# Patient Record
Sex: Female | Born: 1970 | Race: Black or African American | Hispanic: No | Marital: Married | Smoking: Never smoker
Health system: Southern US, Academic
[De-identification: ages and names within clinical notes are randomized; demographics above are authoritative.]

## PROBLEM LIST (undated history)

## (undated) DIAGNOSIS — K219 Gastro-esophageal reflux disease without esophagitis: Secondary | ICD-10-CM

## (undated) DIAGNOSIS — I10 Essential (primary) hypertension: Secondary | ICD-10-CM

## (undated) DIAGNOSIS — I872 Venous insufficiency (chronic) (peripheral): Secondary | ICD-10-CM

## (undated) DIAGNOSIS — G629 Polyneuropathy, unspecified: Secondary | ICD-10-CM

## (undated) HISTORY — PX: HX BREAST IMPLANT: 2100001350

## (undated) HISTORY — PX: BREAST IMPLANT REMOVAL: SUR1101

## (undated) HISTORY — PX: HX TUBAL LIGATION: SHX77

## (undated) HISTORY — PX: ENDOMETRIAL ABLATION: SHX621

## (undated) HISTORY — PX: ESOPHAGOSCOPY / EGD: SUR461

## (undated) HISTORY — PX: HX GALL BLADDER SURGERY/CHOLE: SHX55

---

## 1993-09-08 ENCOUNTER — Other Ambulatory Visit (HOSPITAL_COMMUNITY): Payer: Self-pay

## 2012-08-19 IMAGING — US TRANSABDOMINAL PELVIC
1 series · 14 of 25 positions shown · non-contrast
Comparison: Ultrasound pelvis from [facility] [HOSPITAL] dated August 07, 2012.

Zemagho, Desland
SINARA/JOSMY  Pelvic  Complete, Ultrasound Transvaginal

Parente, Duy
Exam: 
Ultrasound pelvis complete and ultrasound transvaginal
INDICATION: Pain.

[Series 1: transabdominal pelvic · oblique · 0.37mm/px · 14 of 34 slices shown]
[im 1/34]
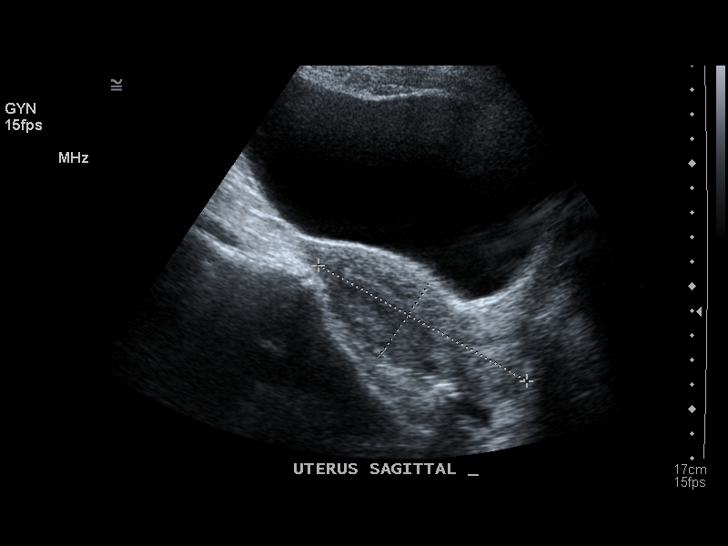
[im 3/34]
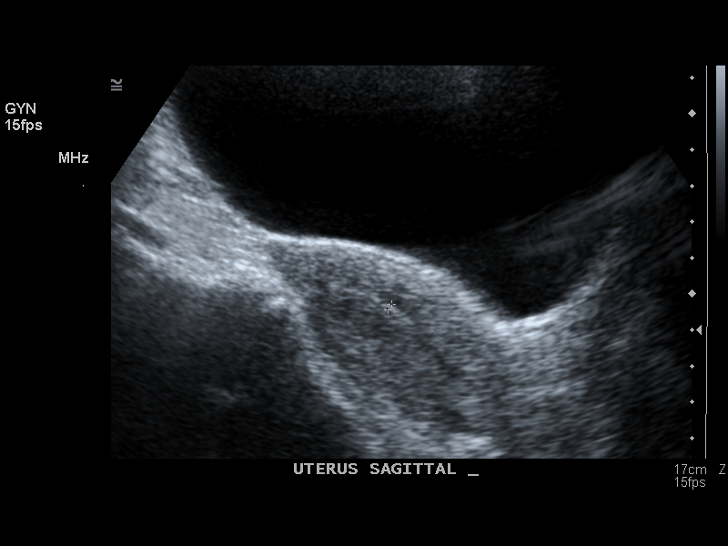
[im 6/34]
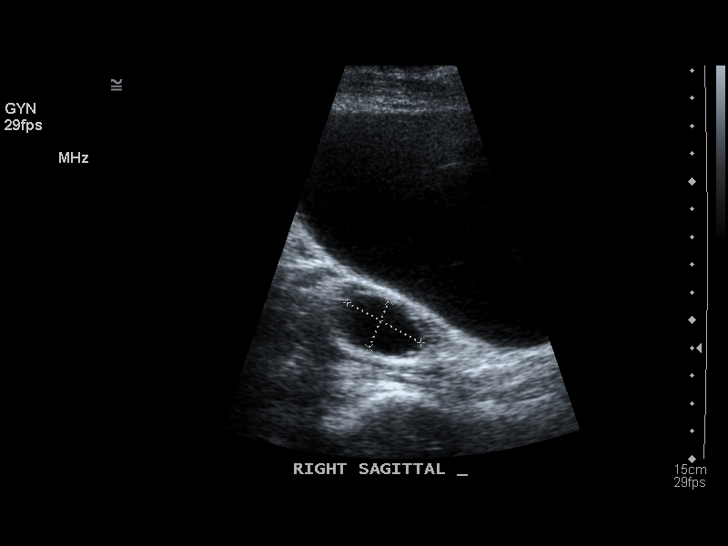
[im 9/34]
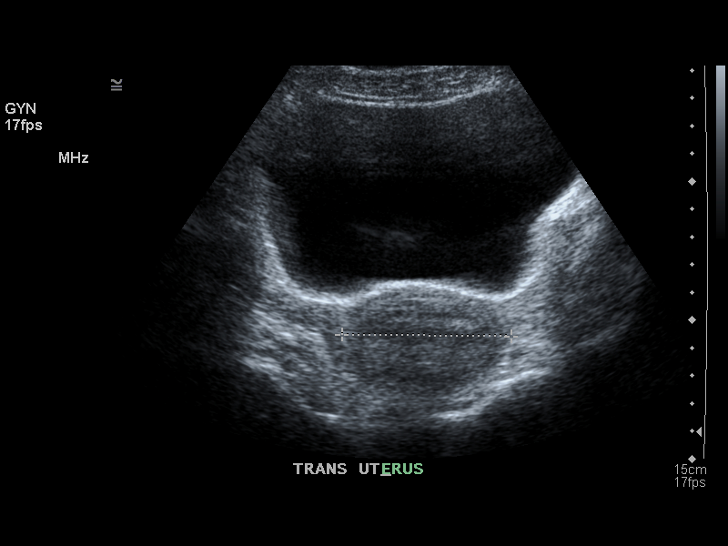
[im 12/34]
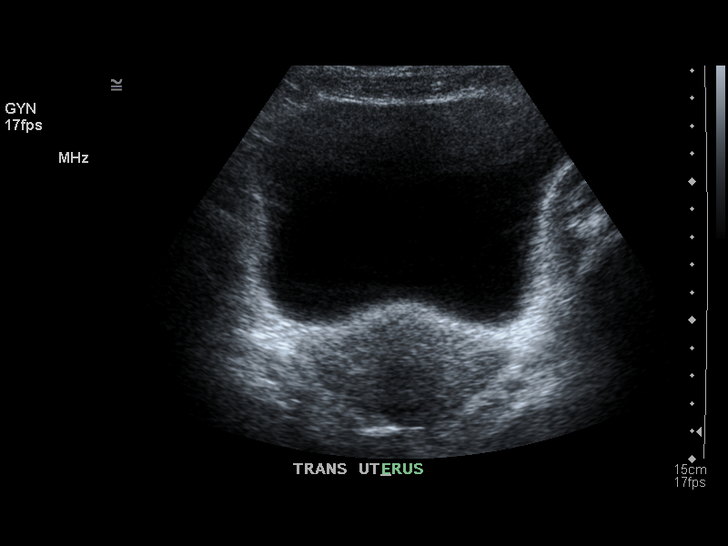
[im 13/34]
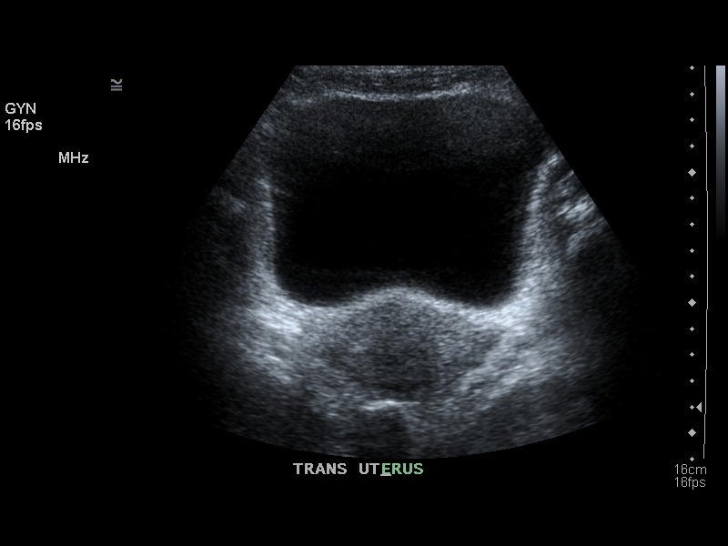
[im 16/34]
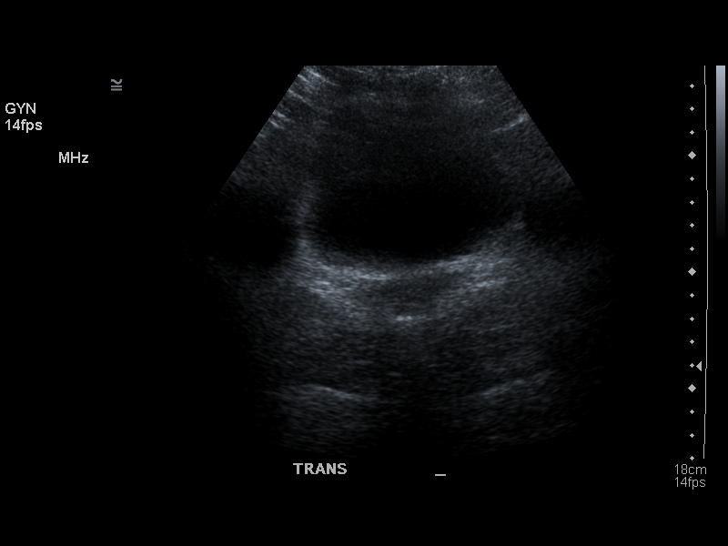
[im 18/34]
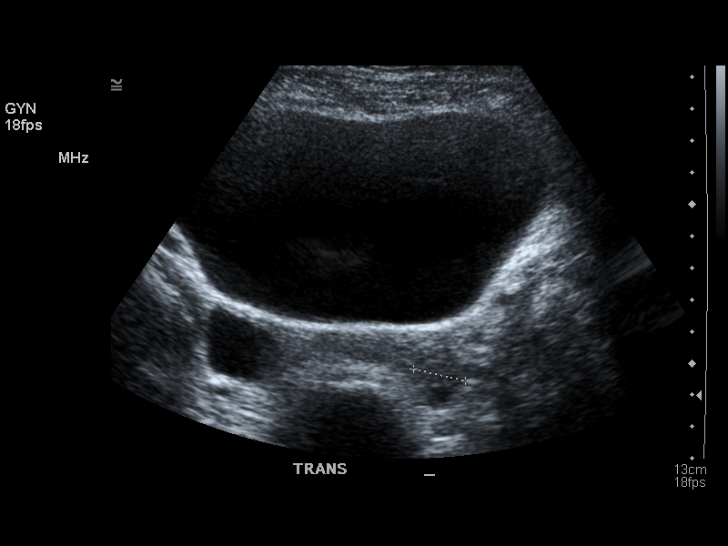
[im 21/34]
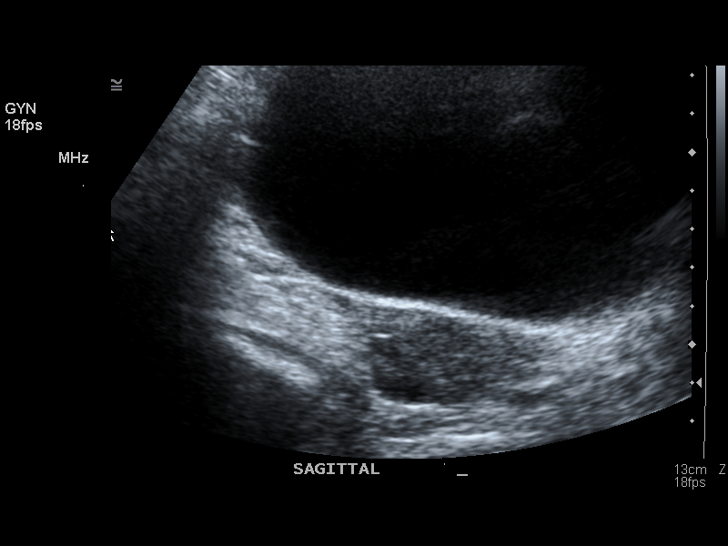
[im 23/34]
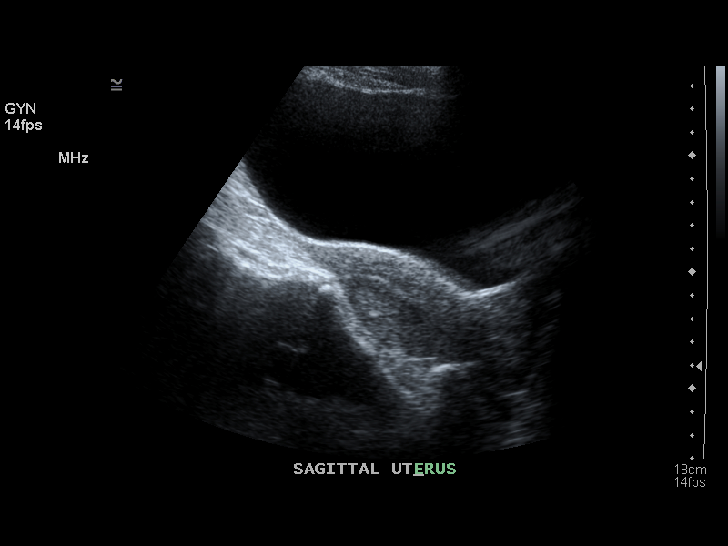
[im 25/34]
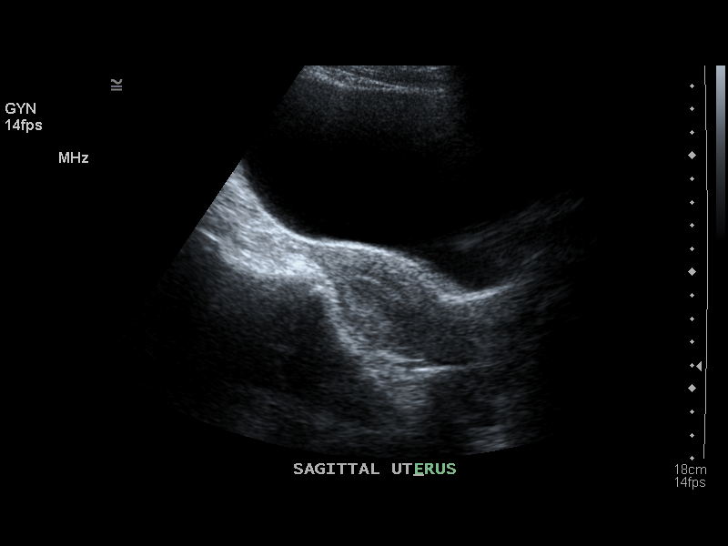
[im 28/34]
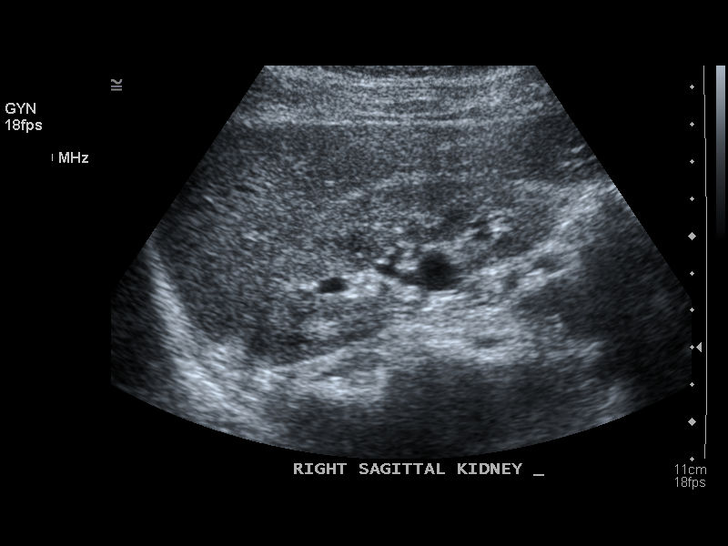
[im 31/34]
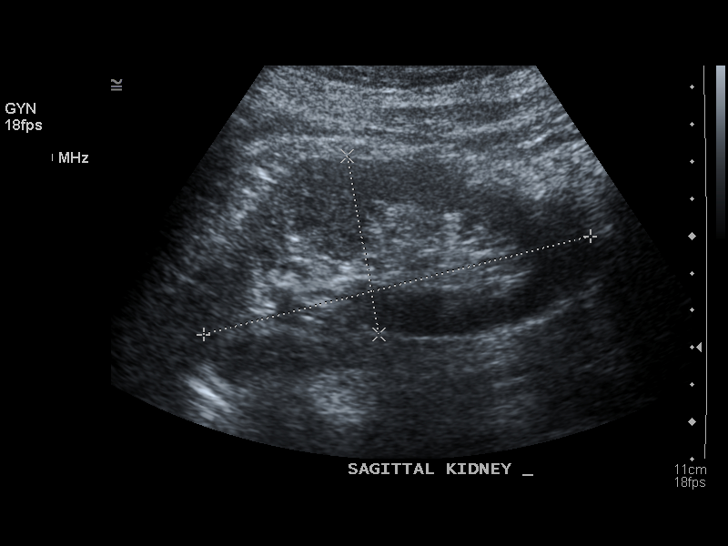
[im 34/34]
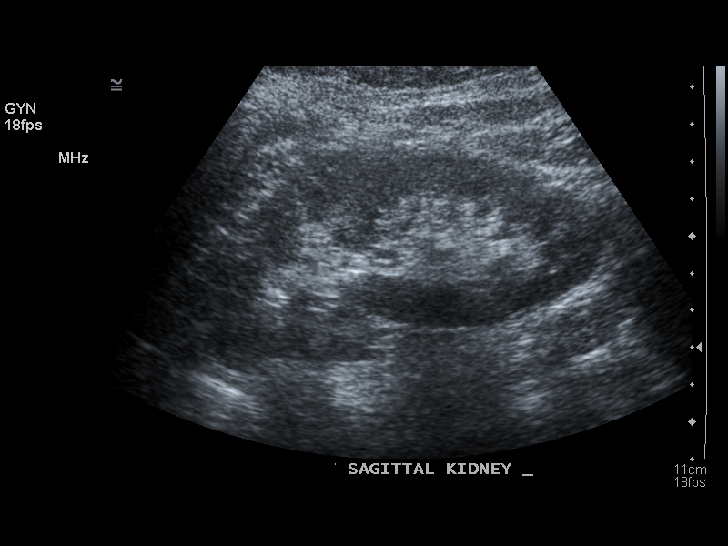

[14 of 25 positions shown; findings below may reference images not displayed]

FINDINGS: Sonographic evaluation of the pelvis was performed transabdominally through a partially distended urinary bladder and transvaginally. 
Uterus is normal in echogenicity and measures 8.0 x 3.9 x 6.3 cm. Endometrial stripe measures 3 mm and is normal. 
The right ovary measures 3.8 x 2.9 x 2.7 cm and contains a 2.8 cm cyst. The left ovary is not definitely visualized. There is no pelvic free fluid or adnexal mass.
IMPRESSION: Unremarkable uterus and endometrial stripe. 
2.8 cm right ovarian cyst. Followup pelvic sonogram is recommended in three months for reassessment. 
Left ovary not definitely visualized. 

________________________________

## 2015-12-18 IMAGING — CR XRAY FOOT COMPLETE LT
1 series · 3 of 3 positions shown · non-contrast
Comparison: None.

Exam:   

Left foot 3V
INDICATION: Pain.

[Series 3: view not recorded · 0.17mm/px · 3 of 3 slices shown]
[im 1/3]
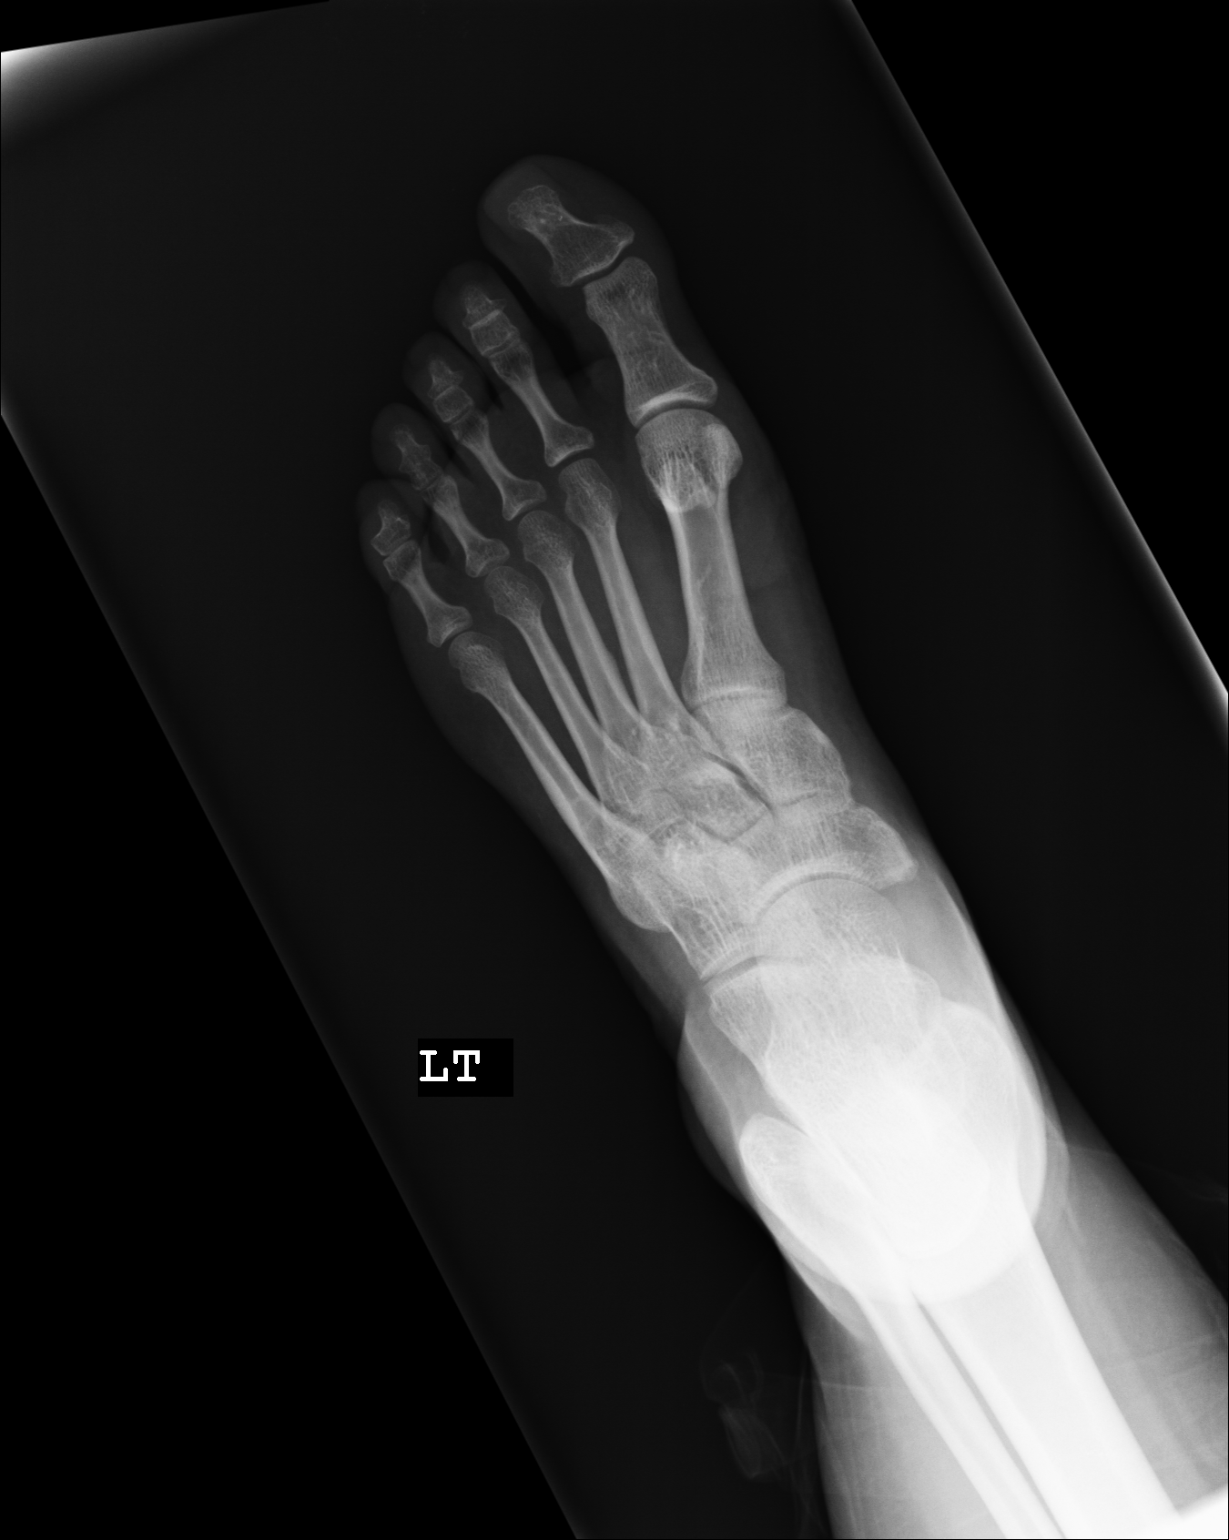
[im 2/3]
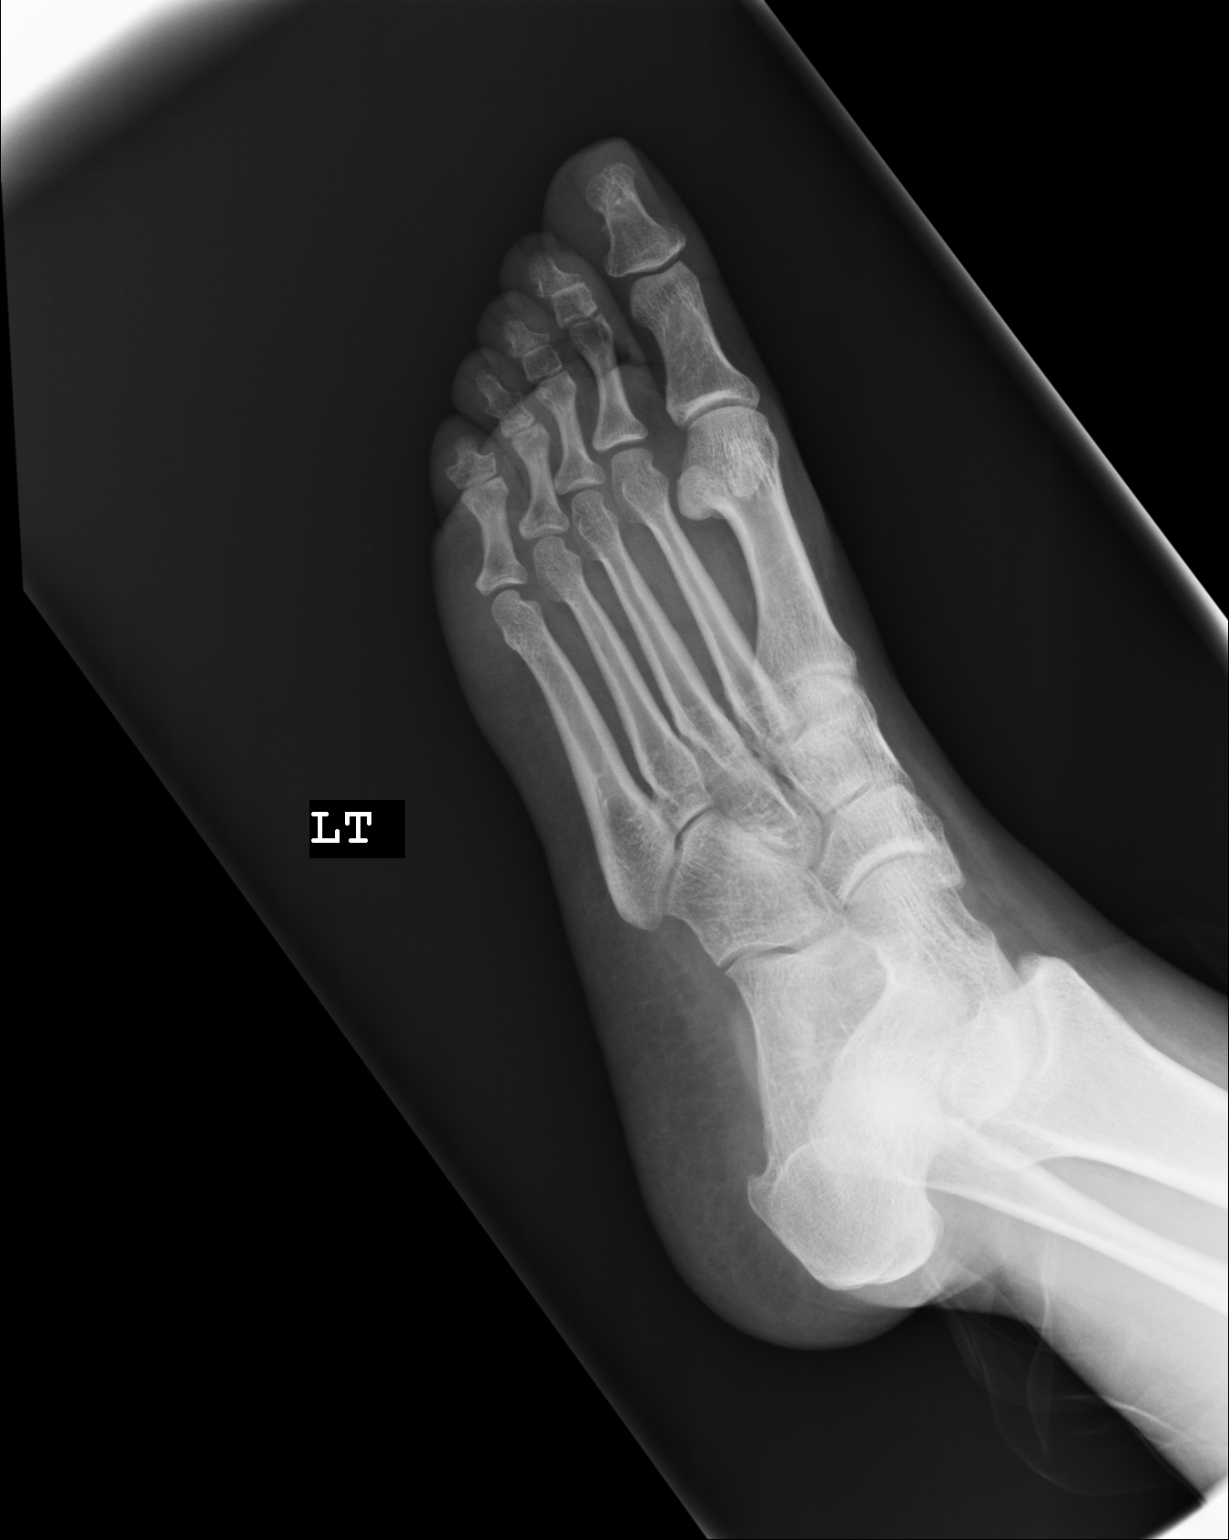
[im 3/3]
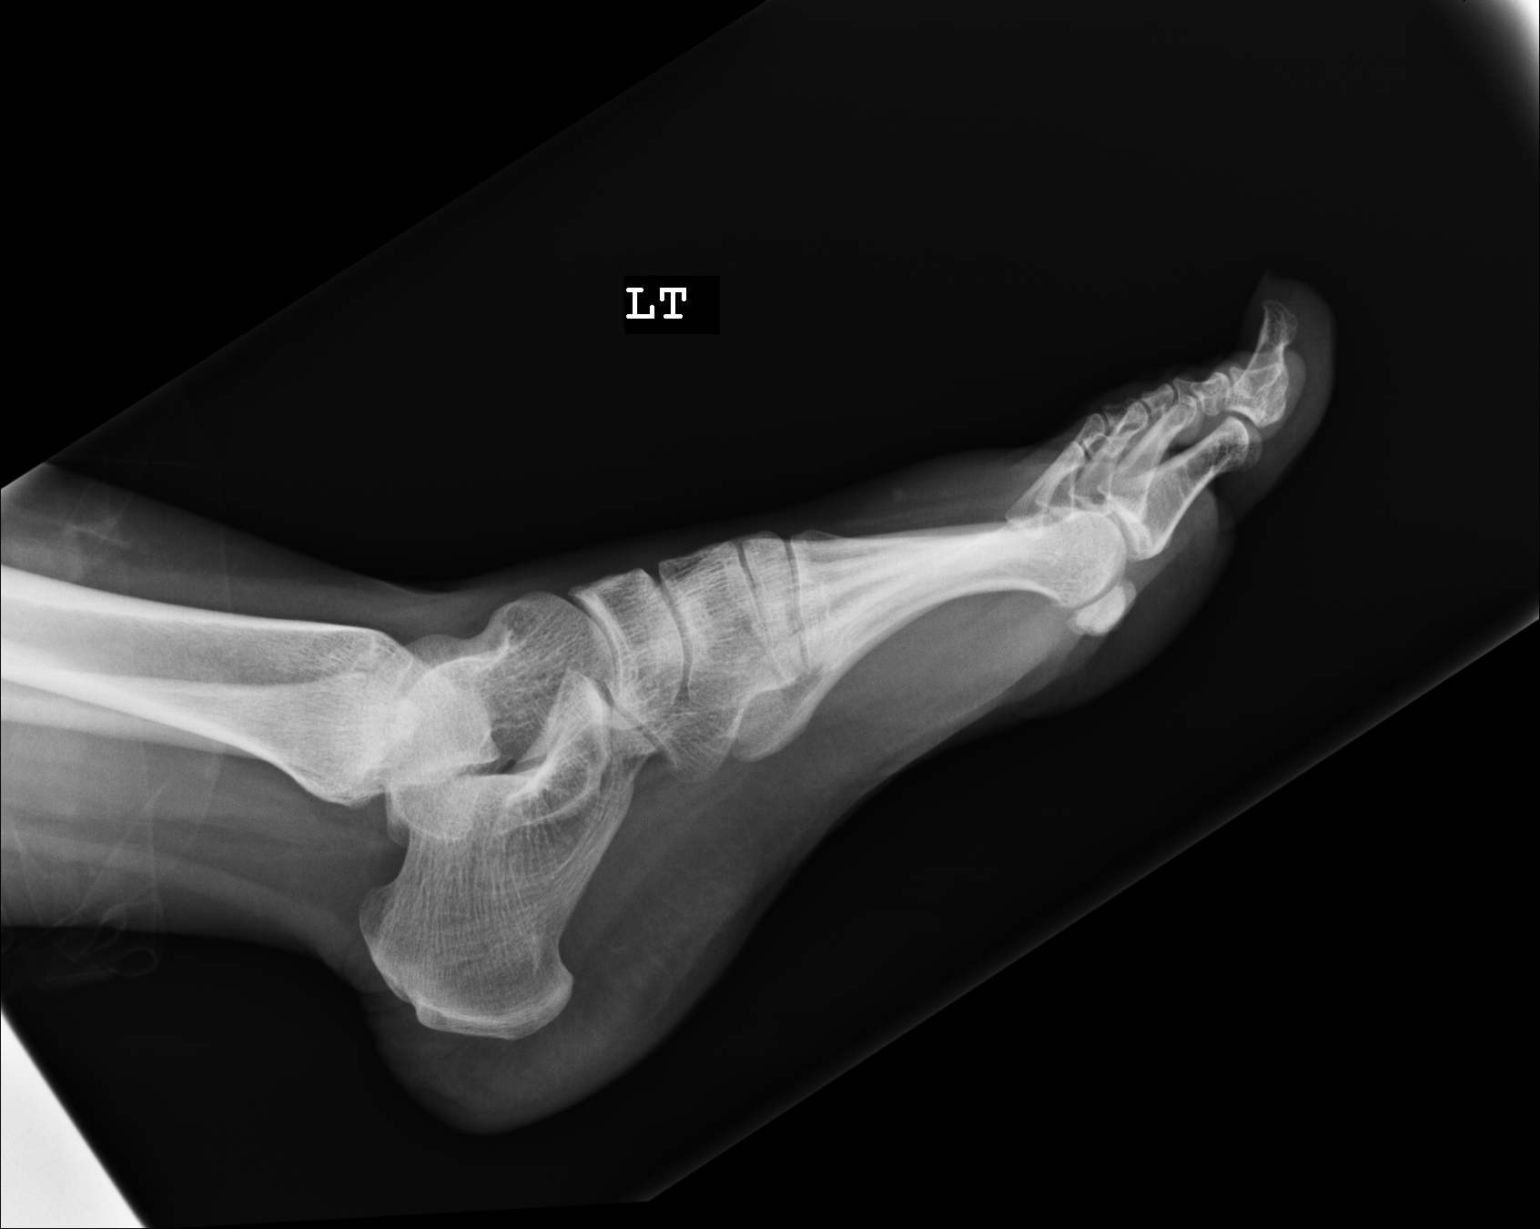

[3 of 3 positions shown; findings below may reference images not displayed]

FINDINGS: The joint spaces are preserved. No fractures are seen.
IMPRESSION: Negative left foot.

## 2020-04-22 IMAGING — MR MRI BRAIN WITHOUT CONTRAST
8 of 9 series · 37 of 48 positions shown · non-contrast
Comparison: None.

﻿EXAM:  MRI BRAIN WITHOUT CONTRAST
INDICATION: 49-year-old with weakness on the left side involving arm and hand with tingling.  No history of trauma or malignancy.
TECHNIQUE: Axial, coronal and sagittal images were obtained without contrast including diffusion-weighted sequence, T2* gradient echo sequence, FLAIR sequences and T1 and T2 sequences.

[Series 5: DWI · axial · 5.0mm · 1.35mm/px · z∈[-54,+66]mm · 10 of 88 slices shown (1 of 3)]
[im 6/88]
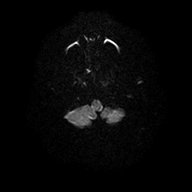
[im 12/88]
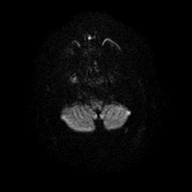
[im 18/88]
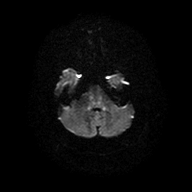
[im 30/88]
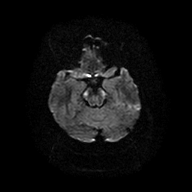
[im 41/88]
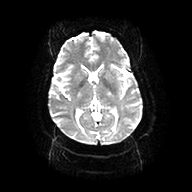
[im 47/88]
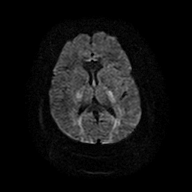
[im 53/88]
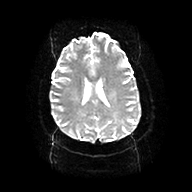
[im 64/88]
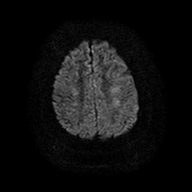
[im 76/88]
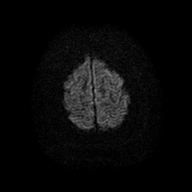
[im 88/88]
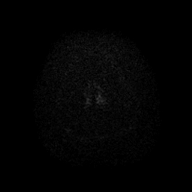

[Series 6: DWI · axial · 5.0mm · 1.35mm/px · z∈[-60,+66]mm · 4 of 22 slices shown (2 of 3)]
[im 1/22]
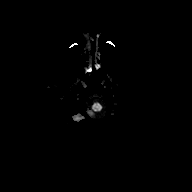
[im 8/22]
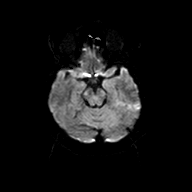
[im 15/22]
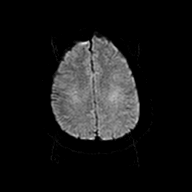
[im 22/22]
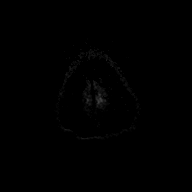

[Series 7: DWI · axial · 5.0mm · 1.35mm/px · z∈[-60,+66]mm · 4 of 22 slices shown (3 of 3)]
[im 1/22]
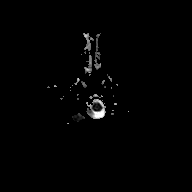
[im 8/22]
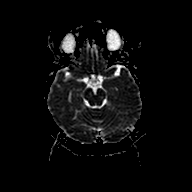
[im 15/22]
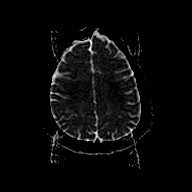
[im 22/22]
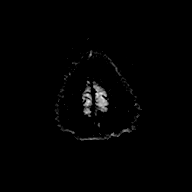

[Series 8: FLAIR · sagittal · 4.0mm · 0.75mm/px · 4 of 26 slices shown (1 of 2)]
[im 1/26]
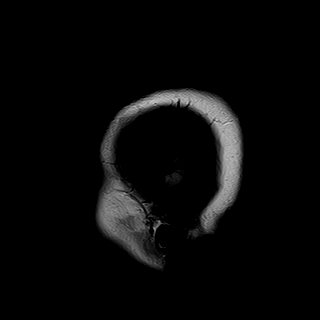
[im 9/26]
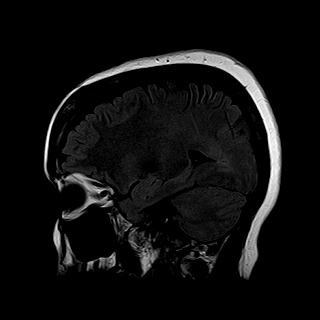
[im 17/26]
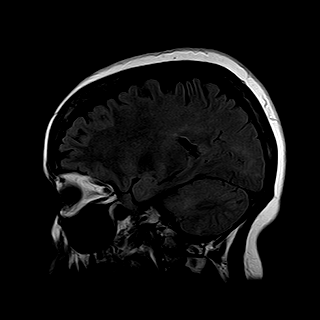
[im 26/26]
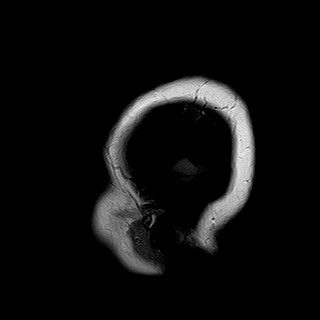

[Series 9: T2 · axial · 5.0mm · 0.43mm/px · z∈[-69,+75]mm · 4 of 25 slices shown (1 of 2)]
[im 1/25]
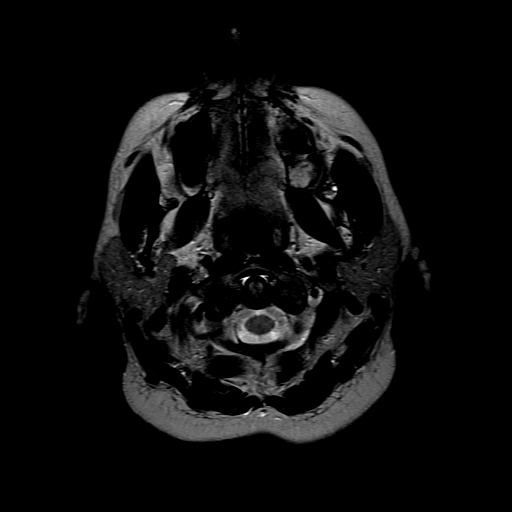
[im 9/25]
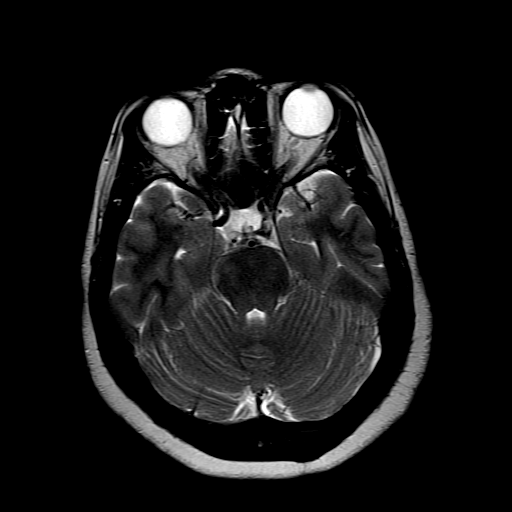
[im 17/25]
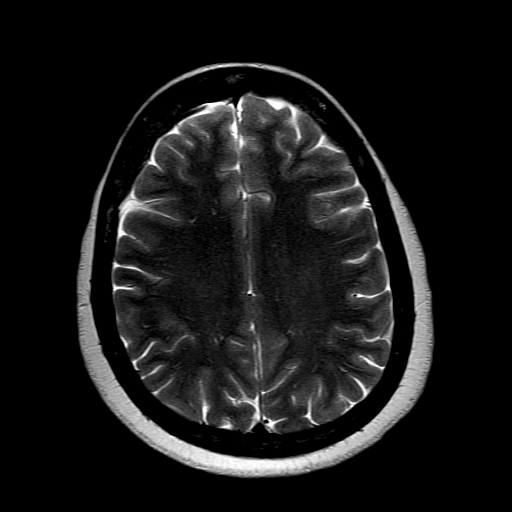
[im 25/25]
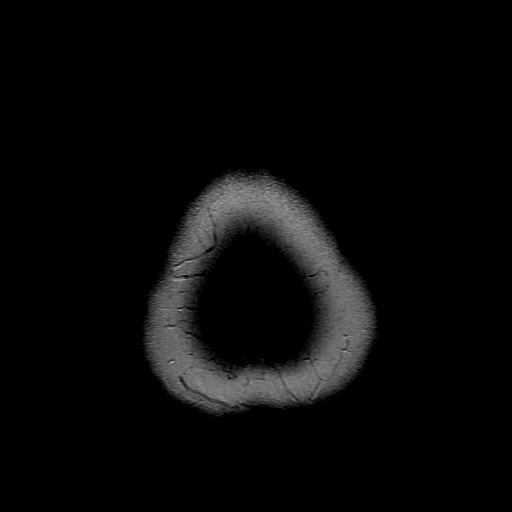

[Series 10: FLAIR · axial · 5.0mm · 0.43mm/px · z∈[-69,+75]mm · 4 of 25 slices shown (2 of 2)]
[im 1/25]
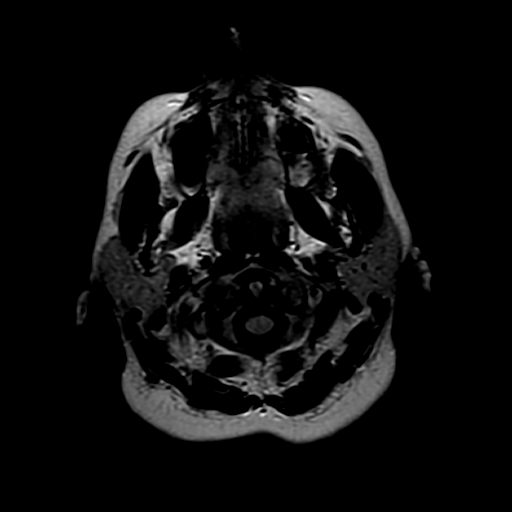
[im 9/25]
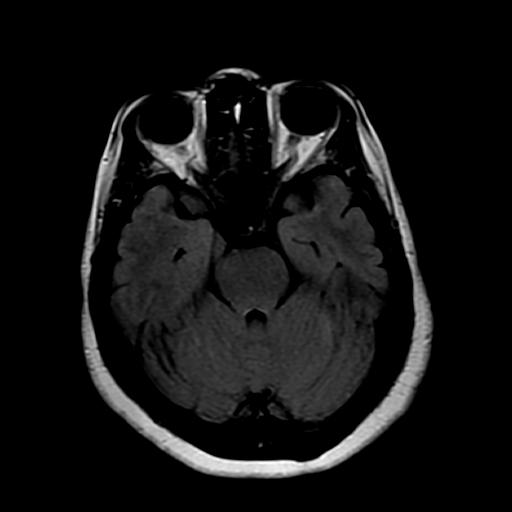
[im 17/25]
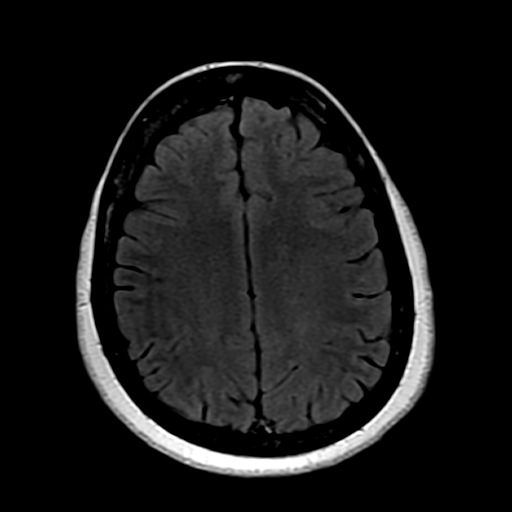
[im 25/25]
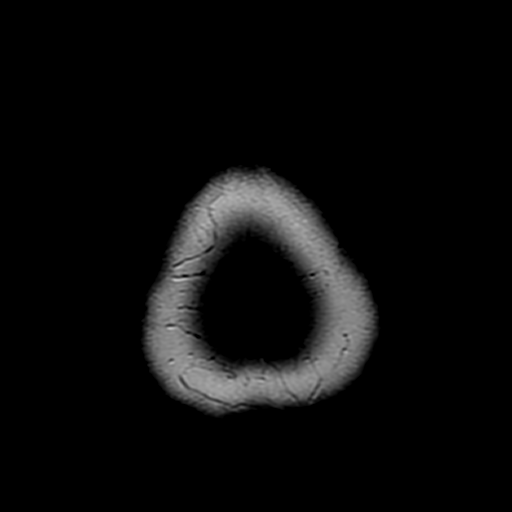

[Series 11: T1 · axial · 5.0mm · 0.43mm/px · z∈[-69,+27]mm · 3 of 25 slices shown]
[im 1/25]
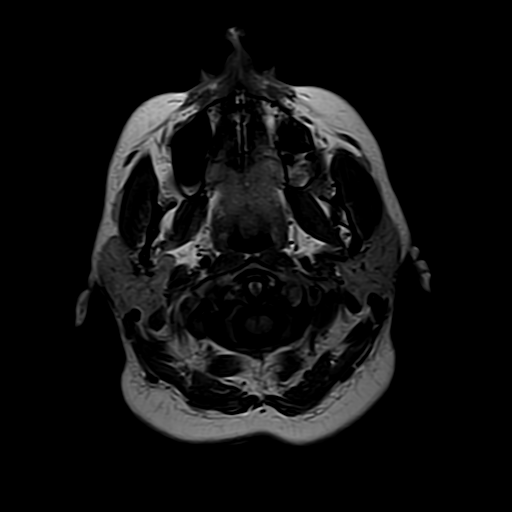
[im 9/25]
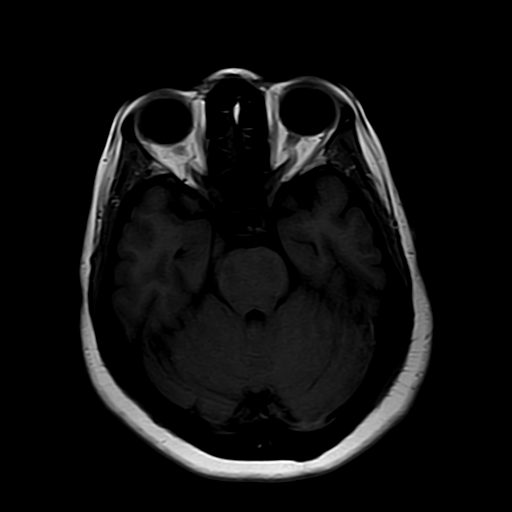
[im 17/25]
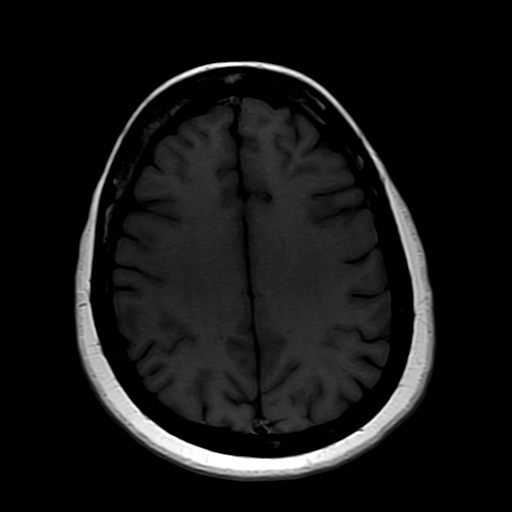

[Series 13: T2 · coronal · 6.0mm · 0.43mm/px · 4 of 24 slices shown (2 of 2)]
[im 1/24]
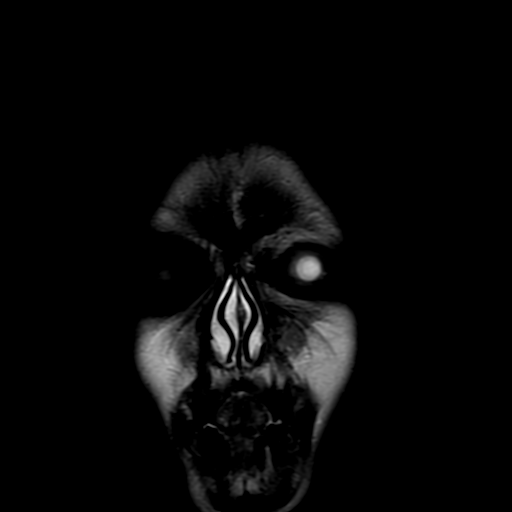
[im 8/24]
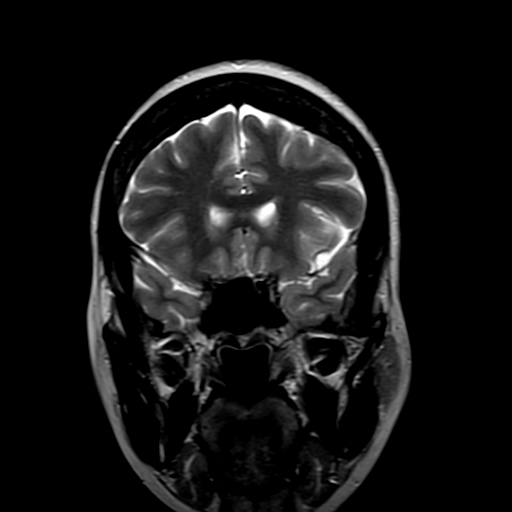
[im 16/24]
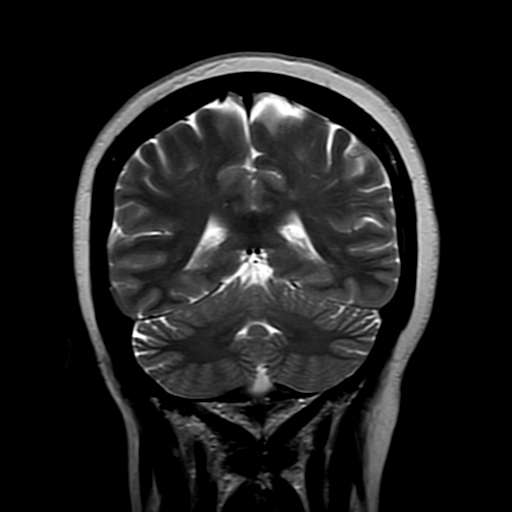
[im 24/24]
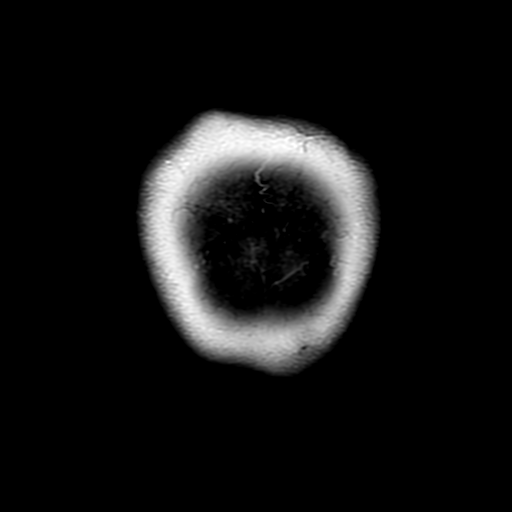

[37 of 48 positions shown; findings below may reference images not displayed]

FINDINGS: No focal areas of restricted diffusion on diffusion sequence.  No evidence of demyelinating lesions on the axial and sagittal FLAIR sequences.  

No evidence of ventriculomegaly or space occupying lesions.  No midline shift.  Normal sized pituitary gland.  

Sinuses and mastoids do not show acute findings.
IMPRESSION: 1. No evidence of acute ischemia, chronic ischemia or demyelinating lesions on this study.

2. No evidence of intracranial space occupying lesions, ventriculomegaly or midline shift.

## 2021-08-14 ENCOUNTER — Encounter (INDEPENDENT_AMBULATORY_CARE_PROVIDER_SITE_OTHER): Payer: Self-pay | Admitting: Family Medicine

## 2021-08-23 ENCOUNTER — Other Ambulatory Visit (INDEPENDENT_AMBULATORY_CARE_PROVIDER_SITE_OTHER): Payer: Self-pay | Admitting: NURSE PRACTITIONER

## 2021-08-23 MED ORDER — LEVOCETIRIZINE 5 MG TABLET
5.0000 mg | ORAL_TABLET | Freq: Every evening | ORAL | 6 refills | Status: DC
Start: 2021-08-23 — End: 2022-01-15

## 2021-10-14 ENCOUNTER — Encounter (HOSPITAL_COMMUNITY): Payer: Self-pay

## 2021-10-14 ENCOUNTER — Emergency Department (HOSPITAL_COMMUNITY): Payer: BC Managed Care – PPO

## 2021-10-14 ENCOUNTER — Emergency Department
Admission: EM | Admit: 2021-10-14 | Discharge: 2021-10-14 | Disposition: A | Discharge status: Home / Self Care | Payer: BC Managed Care – PPO | Attending: Emergency Medicine | Admitting: Emergency Medicine

## 2021-10-14 ENCOUNTER — Other Ambulatory Visit: Payer: Self-pay

## 2021-10-14 DIAGNOSIS — R109 Unspecified abdominal pain: Secondary | ICD-10-CM | POA: Insufficient documentation

## 2021-10-14 DIAGNOSIS — R001 Bradycardia, unspecified: Secondary | ICD-10-CM | POA: Insufficient documentation

## 2021-10-14 DIAGNOSIS — K219 Gastro-esophageal reflux disease without esophagitis: Secondary | ICD-10-CM | POA: Insufficient documentation

## 2021-10-14 HISTORY — DX: Gastro-esophageal reflux disease without esophagitis: K21.9

## 2021-10-14 HISTORY — DX: Polyneuropathy, unspecified: G62.9

## 2021-10-14 HISTORY — DX: Essential (primary) hypertension: I10

## 2021-10-14 HISTORY — DX: Venous insufficiency (chronic) (peripheral): I87.2

## 2021-10-14 LAB — CBC WITH DIFF
BASOPHIL #: 0 10*3/uL (ref 0.00–0.30)
BASOPHIL %: 0 % (ref 0–3)
EOSINOPHIL #: 0.2 10*3/uL (ref 0.00–0.80)
EOSINOPHIL %: 4 % (ref 0–7)
HCT: 40.2 % (ref 37.0–47.0)
HGB: 13.4 g/dL (ref 12.5–16.0)
LYMPHOCYTE #: 2.5 10*3/uL (ref 1.10–5.00)
LYMPHOCYTE %: 41 % (ref 25–45)
MCH: 28.5 pg (ref 27.0–32.0)
MCHC: 33.3 g/dL (ref 32.0–36.0)
MCV: 85.6 fL (ref 78.0–99.0)
MONOCYTE #: 0.4 10*3/uL (ref 0.00–1.30)
MONOCYTE %: 6 % (ref 0–12)
MPV: 7.8 fL (ref 7.4–10.4)
NEUTROPHIL #: 3 10*3/uL (ref 1.80–8.40)
NEUTROPHIL %: 49 % (ref 40–76)
PLATELETS: 291 10*3/uL (ref 140–440)
RBC: 4.7 10*6/uL (ref 4.20–5.40)
RDW: 13.3 % (ref 11.6–14.8)
WBC: 6.2 10*3/uL (ref 4.0–10.5)
WBCS UNCORRECTED: 6.2 10*3/uL

## 2021-10-14 LAB — COMPREHENSIVE METABOLIC PANEL, NON-FASTING
ALBUMIN/GLOBULIN RATIO: 1.6 — ABNORMAL HIGH (ref 0.8–1.4)
ALBUMIN: 4.2 g/dL (ref 3.5–5.7)
ALKALINE PHOSPHATASE: 65 U/L (ref 34–104)
ALT (SGPT): 18 U/L (ref 7–52)
ANION GAP: 7 mmol/L — ABNORMAL LOW (ref 10–20)
AST (SGOT): 19 U/L (ref 13–39)
BILIRUBIN TOTAL: 0.4 mg/dL (ref 0.3–1.2)
BUN/CREA RATIO: 26 — ABNORMAL HIGH (ref 6–22)
BUN: 20 mg/dL (ref 7–25)
CALCIUM, CORRECTED: 9.1 mg/dL (ref 8.9–10.8)
CALCIUM: 9.3 mg/dL (ref 8.6–10.3)
CHLORIDE: 103 mmol/L (ref 98–107)
CO2 TOTAL: 28 mmol/L (ref 21–31)
CREATININE: 0.78 mg/dL (ref 0.60–1.30)
ESTIMATED GFR: 92 mL/min/{1.73_m2} (ref >59)
GLOBULIN: 2.6 — ABNORMAL LOW (ref 2.9–5.4)
GLUCOSE: 95 mg/dL (ref 74–109)
OSMOLALITY, CALCULATED: 278 mOsm/kg (ref 270–290)
POTASSIUM: 3.8 mmol/L (ref 3.5–5.1)
PROTEIN TOTAL: 6.8 g/dL (ref 6.4–8.9)
SODIUM: 138 mmol/L (ref 136–145)

## 2021-10-14 LAB — URINALYSIS, MACROSCOPIC
BILIRUBIN: NEGATIVE mg/dL
BLOOD: NEGATIVE mg/dL
GLUCOSE: NEGATIVE mg/dL
KETONES: NEGATIVE mg/dL
LEUKOCYTES: NEGATIVE WBCs/uL
NITRITE: NEGATIVE
PH: 6.5 (ref 5.0–9.0)
PROTEIN: NEGATIVE mg/dL
SPECIFIC GRAVITY: 1.017 (ref 1.002–1.030)
UROBILINOGEN: NORMAL mg/dL

## 2021-10-14 LAB — LIPASE: LIPASE: 27 U/L (ref 11–82)

## 2021-10-14 LAB — LACTIC ACID LEVEL W/ REFLEX FOR LEVEL >2.0: LACTIC ACID: 0.6 mmol/L (ref 0.5–2.2)

## 2021-10-14 LAB — URINALYSIS, MICROSCOPIC
RBCS: 1 /hpf (ref <4)
SQUAMOUS EPITHELIAL: 1 /hpf (ref <28)

## 2021-10-14 LAB — GOLD TOP TUBE

## 2021-10-14 LAB — BLUE TOP TUBE

## 2021-10-14 MED ORDER — ALUMINUM-MAG HYDROXIDE-SIMETHICONE 200 MG-200 MG-20 MG/5 ML ORAL SUSP
30.0000 mL | Freq: Once | ORAL | Status: DC
Start: 2021-10-14 — End: 2021-10-14

## 2021-10-14 MED ORDER — LIDOCAINE HCL 2 % MUCOSAL SOLUTION
15.0000 mL | Freq: Once | Status: DC
Start: 2021-10-14 — End: 2021-10-14

## 2021-10-14 MED ORDER — GI COCKTAIL (ANTACID SUSP, HYOSCYAMINE, LIDOCAINE)
55.0000 mL | Freq: Once | ORAL | Status: AC
Start: 2021-10-14 — End: 2021-10-14
  Administered 2021-10-14: 55 mL via ORAL
  Filled 2021-10-14: qty 55

## 2021-10-14 MED ORDER — IOHEXOL 350 MG IODINE/ML INTRAVENOUS SOLUTION
50.0000 mL | INTRAVENOUS | Status: AC
Start: 2021-10-14 — End: 2021-10-14
  Administered 2021-10-14: 100 mL via INTRAVENOUS

## 2021-10-14 MED ORDER — HYOSCYAMINE 0.125 MG/5 ML ORAL ELIXIR
10.0000 mL | ORAL_SOLUTION | Freq: Once | ORAL | Status: DC
Start: 2021-10-14 — End: 2021-10-14

## 2021-10-14 NOTE — ED Triage Notes (Signed)
CENTRAL ABDOMINAL PAIN WORSENING OVER THE LAST MONTH. HAS SEEN DR. PATEL AND WAS DX WITH ULCERS BUT IS NOT IMPROVING WITH MEDS GIVEN. PT STATES PAIN FEEL SIMILAR TO WHEN HER GALLBLADDER WAS BAD.

## 2021-10-14 NOTE — ED Nurses Note (Signed)
D/C'D TO HOME, NO ACUTE DISTRESS NOTED. VOICED UNDERSTANDING OF D/C INSTRUCTIONS.

## 2021-10-14 NOTE — Discharge Instructions (Signed)

## 2021-10-14 NOTE — ED Provider Notes (Signed)
[facility]  ED Primary Provider Note  Patient Name: Vicki Orr  Patient Age: 51 y.o.  Date of Birth: 04-16-1971    Chief Complaint: Abdominal Pain        History of Present Illness       Vicki Orr is a 51 y.o. female who had concerns including Abdominal Pain.  This patient is a 51 year old female who presents with chronic abdominal related discomfort that worsened last night.  Patient has underwent multiple endoscopies, with multiple testing by Gastroenterology.  She is treated for gastric reflux.        Review of Systems     No other overt Review of Systems are noted to be positive except noted in the HPI.      Historical Data   History Reviewed This Encounter:        Physical Exam   ED Triage Vitals [10/14/21 0939]   BP (Non-Invasive) 119/78   Heart Rate 74   Respiratory Rate 18   Temperature 35.9 C (96.6 F)   SpO2 99 %   Weight 68.9 kg (152 lb)   Height 1.575 m ('5\' 2"'$ )         Nursing notes reviewed for what could be assessed. Past Medical, Surgical, and Social history reviewed for what has been completed. Exam limited in the setting of personal protective equipment.    Constitutional: NAD. Well-Developed. Well Nourished.  Head: Normocephalic, atraumatic.  Ears:  Normal to exterior evaluation  Eyes: EOM grossly intact, conjunctiva normal.  Neck: Supple  Cardiovascular: Regular Rate and Rhythm, extremities well perfused.  Pulmonary/Chest: No respiratory distress. Lungs are symmetric to auscultation bilaterally.  Abdominal: Soft, non-tender, non-distended. Non peritoneal, no rebound, no guarding.  MSK: No Lower Extremity Edema.  Skin: Warm, dry, and intact  Neuro: Appropriate, CN II-XII grossly intact.  Psych:  Extremely Pleasant              Procedures      Patient Data     Labs Ordered/Reviewed   COMPREHENSIVE METABOLIC PANEL, NON-FASTING - Abnormal; Notable for the following components:       Result Value    ANION GAP 7 (*)     BUN/CREA RATIO 26 (*)      ALBUMIN/GLOBULIN RATIO 1.6 (*)     GLOBULIN 2.6 (*)     All other components within normal limits    Narrative:     Estimated Glomerular Filtration Rate (eGFR) is calculated using the CKD-EPI (2021) equation, intended for patients 26 years of age and older. If gender is not documented or "unknown", there will be no eGFR calculation.   LIPASE - Normal   LACTIC ACID LEVEL W/ REFLEX FOR LEVEL >2.0 - Normal   URINALYSIS, MACROSCOPIC - Normal   CBC/DIFF    Narrative:     The following orders were created for panel order CBC/DIFF.  Procedure                               Abnormality         Status                     ---------                               -----------         ------  CBC WITH DIFF[[id number]]                                    Final result                 Please view results for these tests on the individual orders.   URINALYSIS, MACROSCOPIC AND MICROSCOPIC W/CULTURE REFLEX    Narrative:     The following orders were created for panel order URINALYSIS, MACROSCOPIC AND MICROSCOPIC W/CULTURE REFLEX.  Procedure                               Abnormality         Status                     ---------                               -----------         ------                     URINALYSIS, MACROSCOPIC[[id number]]      Normal              Final result               URINALYSIS, MICROSCOPIC[[id number]]                          Final result                 Please view results for these tests on the individual orders.   CBC WITH DIFF   URINALYSIS, MICROSCOPIC   BLUE TOP TUBE   EXTRA TUBES    Narrative:     The following orders were created for panel order EXTRA TUBES.  Procedure                               Abnormality         Status                     ---------                               -----------         ------                     BLUE TOP RAQT[[id number]]                                    Final result               GOLD TOP TGYB[[id number]]                                    In process                    Please view results for these tests on the individual orders.   GOLD TOP TUBE  No orders to display       Medical Decision Making          MDM      Studies Assessed:  Lab, EKG, radiology ordered    EKG:   This EKG interpreted by me shows:    Rate:  59 beats per minute    Interpretation:  PR 140, No consistent ST Elevation, No Acute STEMI Identified.      MDM Narrative:  This patient is a 51 year old female who presents with abdominal pain.  Patient does not have any tenderness, and has been worked up for this chronic related complaint.  I discussed the unlikely her being able to find an acute diagnosis today offer patient's CT scan giving her reported proportion to exam to assess for potential vascular related discomfort.  After workup returned, patient not have an elevated lactate, and the CT scan had no acute abnormality.  Return precautions were given with gastroenterology follow-up recommended.             Medications Administered in the ED   GI cocktail (ANTACID SUSP, HYOSCYAMINE, LIDOCAINE) oral solution (55 mL Oral Given 10/14/21 1400)       Following the history, physical exam, and ED workup, the patient was deemed stable and suitable for discharge. The patient/caregiver was advised to return to the ED for any new or worsening symptoms. Discharge medications, and follow-up instructions were discussed with the patient/caregiver in detail, who verbalizes understanding. The patient/caregiver is in agreement and is comfortable with the plan of care.    Disposition: Discharged         Current Discharge Medication List      ASK your doctor about these medications.      Details   Levocetirizine 5 mg Tablet  Commonly known as: XYZAL  Ask about: Should I take this medication?   5 mg, Oral, EVERY EVENING  Qty: 30 Tablet  Refills: 6          Follow up:   No follow-up provider specified.             Clinical Impression   Abdominal pain, unspecified abdominal location (Primary)         Current Discharge  Medication List            R. Baldo Daub, MD, [facility]  Department of Emergency Medicine

## 2021-10-21 LAB — ECG 12 LEAD
Atrial Rate: 59 {beats}/min
Calculated P Axis: 68 degrees
Calculated R Axis: 65 degrees
Calculated T Axis: 54 degrees
PR Interval: 140 ms
QRS Duration: 72 ms
QT Interval: 414 ms
QTC Calculation: 409 ms
Ventricular rate: 59 {beats}/min

## 2021-12-09 ENCOUNTER — Other Ambulatory Visit (INDEPENDENT_AMBULATORY_CARE_PROVIDER_SITE_OTHER): Payer: Self-pay | Admitting: Family Medicine

## 2021-12-13 ENCOUNTER — Encounter (INDEPENDENT_AMBULATORY_CARE_PROVIDER_SITE_OTHER): Payer: Self-pay | Admitting: NURSE PRACTITIONER

## 2021-12-20 ENCOUNTER — Other Ambulatory Visit (HOSPITAL_COMMUNITY): Payer: Self-pay | Admitting: FAMILY PRACTICE

## 2021-12-20 DIAGNOSIS — R002 Palpitations: Secondary | ICD-10-CM

## 2021-12-24 ENCOUNTER — Other Ambulatory Visit (HOSPITAL_COMMUNITY): Payer: Self-pay | Admitting: FAMILY PRACTICE

## 2021-12-24 DIAGNOSIS — R002 Palpitations: Secondary | ICD-10-CM

## 2021-12-30 ENCOUNTER — Encounter (HOSPITAL_COMMUNITY): Payer: Self-pay | Admitting: Emergency Medicine

## 2021-12-30 ENCOUNTER — Other Ambulatory Visit: Payer: Self-pay

## 2021-12-30 ENCOUNTER — Emergency Department
Admission: EM | Admit: 2021-12-30 | Discharge: 2021-12-30 | Disposition: A | Discharge status: Home / Self Care | Payer: BC Managed Care – PPO | Attending: Emergency Medicine | Admitting: Emergency Medicine

## 2021-12-30 DIAGNOSIS — Z634 Disappearance and death of family member: Secondary | ICD-10-CM

## 2021-12-30 DIAGNOSIS — Z8711 Personal history of peptic ulcer disease: Secondary | ICD-10-CM | POA: Insufficient documentation

## 2021-12-30 DIAGNOSIS — F4321 Adjustment disorder with depressed mood: Secondary | ICD-10-CM | POA: Insufficient documentation

## 2021-12-30 DIAGNOSIS — E871 Hypo-osmolality and hyponatremia: Secondary | ICD-10-CM | POA: Insufficient documentation

## 2021-12-30 DIAGNOSIS — R197 Diarrhea, unspecified: Secondary | ICD-10-CM | POA: Insufficient documentation

## 2021-12-30 LAB — URINALYSIS, MICROSCOPIC
BACTERIA: NEGATIVE /hpf
RBCS: <1 /hpf (ref <4)
SQUAMOUS EPITHELIAL: 16 /hpf (ref <28)
WBCS: 1 /hpf (ref <6)

## 2021-12-30 LAB — COMPREHENSIVE METABOLIC PANEL, NON-FASTING
ALBUMIN/GLOBULIN RATIO: 1.6 — ABNORMAL HIGH (ref 0.8–1.4)
ALBUMIN: 4.1 g/dL (ref 3.5–5.7)
ALKALINE PHOSPHATASE: 64 U/L (ref 34–104)
ALT (SGPT): 11 U/L (ref 7–52)
ANION GAP: 7 mmol/L (ref 4–13)
AST (SGOT): 14 U/L (ref 13–39)
BILIRUBIN TOTAL: 0.6 mg/dL (ref 0.3–1.2)
BUN/CREA RATIO: 13 (ref 6–22)
BUN: 9 mg/dL (ref 7–25)
CALCIUM, CORRECTED: 9.3 mg/dL (ref 8.9–10.8)
CALCIUM: 9.4 mg/dL (ref 8.6–10.3)
CHLORIDE: 102 mmol/L (ref 98–107)
CO2 TOTAL: 29 mmol/L (ref 21–31)
CREATININE: 0.67 mg/dL (ref 0.60–1.30)
ESTIMATED GFR: 106 mL/min/{1.73_m2} (ref >59)
GLOBULIN: 2.5 — ABNORMAL LOW (ref 2.9–5.4)
GLUCOSE: 93 mg/dL (ref 74–109)
OSMOLALITY, CALCULATED: 274 mOsm/kg (ref 270–290)
POTASSIUM: 2.8 mmol/L — ABNORMAL LOW (ref 3.5–5.1)
PROTEIN TOTAL: 6.6 g/dL (ref 6.4–8.9)
SODIUM: 138 mmol/L (ref 136–145)

## 2021-12-30 LAB — CBC WITH DIFF
BASOPHIL #: 0.1 10*3/uL (ref 0.00–0.30)
BASOPHIL %: 1 % (ref 0–3)
EOSINOPHIL #: 0.1 10*3/uL (ref 0.00–0.80)
EOSINOPHIL %: 2 % (ref 0–7)
HCT: 40.3 % (ref 37.0–47.0)
HGB: 13.6 g/dL (ref 12.5–16.0)
LYMPHOCYTE #: 2.1 10*3/uL (ref 1.10–5.00)
LYMPHOCYTE %: 36 % (ref 25–45)
MCH: 28.8 pg (ref 27.0–32.0)
MCHC: 33.9 g/dL (ref 32.0–36.0)
MCV: 85.1 fL (ref 78.0–99.0)
MONOCYTE #: 0.6 10*3/uL (ref 0.00–1.30)
MONOCYTE %: 11 % (ref 0–12)
MPV: 7.7 fL (ref 7.4–10.4)
NEUTROPHIL #: 2.9 10*3/uL (ref 1.80–8.40)
NEUTROPHIL %: 50 % (ref 40–76)
PLATELETS: 343 10*3/uL (ref 140–440)
RBC: 4.73 10*6/uL (ref 4.20–5.40)
RDW: 13.4 % (ref 11.6–14.8)
WBC: 5.7 10*3/uL (ref 4.0–10.5)
WBCS UNCORRECTED: 5.7 10*3/uL

## 2021-12-30 LAB — URINALYSIS, MACROSCOPIC
BILIRUBIN: NEGATIVE mg/dL
BLOOD: NEGATIVE mg/dL
GLUCOSE: NEGATIVE mg/dL
LEUKOCYTES: NEGATIVE WBCs/uL
NITRITE: NEGATIVE
PH: 7 (ref 5.0–9.0)
PROTEIN: NEGATIVE mg/dL
SPECIFIC GRAVITY: 1.008 (ref 1.002–1.030)
UROBILINOGEN: NORMAL mg/dL

## 2021-12-30 LAB — GRAY TOP TUBE

## 2021-12-30 LAB — BLUE TOP TUBE

## 2021-12-30 LAB — LIPASE: LIPASE: 12 U/L (ref 11–82)

## 2021-12-30 LAB — OCCULT BLOOD, STOOL (IFOB): IFOB: POSITIVE — AB

## 2021-12-30 LAB — LIGHT GREEN TOP TUBE

## 2021-12-30 LAB — GOLD TOP TUBE

## 2021-12-30 LAB — C. DIFFICILE PCR
C. DIFFICILE TOXIN GENE, PCR: NEGATIVE
PRESUMPTIVE 027/NAP1/BI: NEGATIVE

## 2021-12-30 MED ORDER — POTASSIUM CHLORIDE 20 MEQ/100ML IN STERILE WATER INTRAVENOUS PIGGYBACK
INJECTION | INTRAVENOUS | Status: AC
Start: 2021-12-30 — End: 2021-12-30
  Filled 2021-12-30: qty 100

## 2021-12-30 MED ORDER — POTASSIUM CHLORIDE 20 MEQ/100ML IN STERILE WATER INTRAVENOUS PIGGYBACK
20.0000 meq | INJECTION | INTRAVENOUS | Status: AC
Start: 2021-12-30 — End: 2021-12-30
  Administered 2021-12-30: 0 meq via INTRAVENOUS
  Administered 2021-12-30: 20 meq via INTRAVENOUS

## 2021-12-30 MED ORDER — POTASSIUM CHLORIDE ER 20 MEQ TABLET,EXTENDED RELEASE(PART/CRYST)
40.0000 meq | ORAL_TABLET | ORAL | Status: AC
Start: 2021-12-30 — End: 2021-12-30
  Administered 2021-12-30: 40 meq via ORAL

## 2021-12-30 MED ORDER — DIPHENOXYLATE-ATROPINE 2.5 MG-0.025 MG TABLET
1.0000 | ORAL_TABLET | Freq: Four times a day (QID) | ORAL | 0 refills | Status: AC | PRN
Start: 2021-12-30 — End: ?

## 2021-12-30 MED ORDER — SODIUM CHLORIDE 0.9 % IV BOLUS
1000.0000 mL | INJECTION | Status: AC
Start: 2021-12-30 — End: 2021-12-30
  Administered 2021-12-30: 0 mL via INTRAVENOUS
  Administered 2021-12-30: 1000 mL via INTRAVENOUS

## 2021-12-30 MED ORDER — METRONIDAZOLE 500 MG TABLET
500.0000 mg | ORAL_TABLET | Freq: Three times a day (TID) | ORAL | 0 refills | Status: AC
Start: 2021-12-30 — End: 2022-01-06

## 2021-12-30 MED ORDER — POTASSIUM CHLORIDE ER 20 MEQ TABLET,EXTENDED RELEASE(PART/CRYST)
ORAL_TABLET | ORAL | Status: AC
Start: 2021-12-30 — End: 2021-12-30
  Filled 2021-12-30: qty 2

## 2021-12-30 NOTE — ED Provider Notes (Signed)
[facility]  ED Primary Provider Note  Patient Name: Vicki Orr  Patient Age: 51 y.o.  Date of Birth: 08-30-1970    Chief Complaint: Abdominal Pain        History of Present Illness       Vicki Orr is a 52 y.o. Orr who had concerns including Abdominal Pain.     HPI     Chief complaint diarrhea.      HPI  Severity:  Moderate to severe.  She states she has several diarrhea stools per day.    Onset acute   Timing 12/11/2021  Context:  Patient states it started after the murder of her son on 12/09/2021  Prior workup:  Patient has had no prior workup for this condition.  However she does see Dr. Keith Rake.  She does have a history of significant peptic ulcer disease.  She is had 2 EGDs in the recent past.  She states she has a follow-up EGD in September to see if the medications working.    Mechanism:  Unknown   Additional information: She does have a history of a cholecystectomy in the past   Additional symptoms:  No blood in his stool but she does have dark stools secondary to iron supplementation tablets.  She has no vomiting.  She does have abdominal pain but she states it is mainly related to being sore from having diarrhea.  She has no dysuria or frequency.  She denies any fever or chills.  She denies any mucus or green/yellow discoloration of the stool.  Modifying factors:  Nothing really helps.  However she states that any p.o. intake makes her go straight to the bathroom to have a stool.  She states that eating liquid can make it happened.  PMH: reviewed and listed on the chart below.   SH: reviewed and listed on the chart below.         PMHx:    Past Medical History:   Diagnosis Date    GERD (gastroesophageal reflux disease)     HTN (hypertension)     Neuropathy (CMS HCC)     Venous (peripheral) insufficiency     PSHx:   Past Surgical History:   Procedure Laterality Date    BREAST IMPLANT REMOVAL Bilateral     ENDOMETRIAL ABLATION      ESOPHAGOSCOPY / EGD      x 2  since January 2023    HX BREAST IMPLANT Bilateral     HX CHOLECYSTECTOMY      HX TUBAL LIGATION         Allergies:    Allergies   Allergen Reactions    Percocet [Oxycodone] Itching    Social History  Social History     Tobacco Use    Smoking status: Never    Smokeless tobacco: Never   Vaping Use    Vaping Use: Never used   Substance Use Topics    Alcohol use: Never    Drug use: Never       Family History  Family Medical History:    None            Home Meds:      Prior to Admission medications    Not on File          ROS: No other overt Review of Systems are noted to be positive except noted in the HPI.        Physical Exam   ED Triage  Vitals [12/30/21 0652]   BP (Non-Invasive) (!) 143/83   Heart Rate 91   Respiratory Rate 16   Temperature 36.9 C (98.4 F)   SpO2 97 %   Weight 67.7 kg (149 lb 3.2 oz)   Height 1.575 m ('5\' 2"'$ )         Physical exam  Constitutional/general: The nursing notes were reviewed and agreed upon.  The vital signs were reviewed and are listed on the chart.   Vicki Orr.  She is in no acute distress.  She appears to be comfortable and in no pain.    HEENT: Eyes show normal extraocular movements.  Mildly dry oral mucosa is noted.  There is no facial trauma or abnormalities.  Neck: No midline tenderness, no meningeal signs.  Full range of motion.  No JVD.  No carotid bruits.  Cardiovascular: Heart is regular rate and rhythm S1-S2 sounds were auscultated without murmur click or rub  Respiratory: Lungs are clear to auscultation in all fields without wheeze rale or rhonchi.  GI: Abdomen is soft non tender normal bowel sounds are auscultated.  There is no rebound tenderness or guarding  Neuro cranial nerves II through XII are intact and normal.  Patient has normal speech and normal gait.  There is no muscle weakness in any extremities.  Sensation is intact throughout.   Psych: Patient is alert and oriented person place and time.  Patient is Vicki pleasant  converse with has a euthymic affect.  Patient has appropriate emotional discord when speaking of her son's death.  She does appear to be depressed but she has Vicki good eye contact and conversation.  Skin: No rash.  No petechiae or purpura.  The skin is warm and dry without diaphoresis.  There is no pallor.  Musculoskeletal: There is no tenderness to palpation in any body region.  There is no lower extremity edema and no calf tenderness.  Negative Homan's sign bilaterally.  GU: Deferred        Procedures      Patient Data     Labs Ordered/Reviewed   OCCULT BLOOD, STOOL (IFOB) - Abnormal; Notable for the following components:       Result Value    IFOB Positive (*)     All other components within normal limits   COMPREHENSIVE METABOLIC PANEL, NON-FASTING - Abnormal; Notable for the following components:    POTASSIUM 2.8 (*)     ALBUMIN/GLOBULIN RATIO 1.6 (*)     GLOBULIN 2.5 (*)     All other components within normal limits    Narrative:     Estimated Glomerular Filtration Rate (eGFR) is calculated using the CKD-EPI (2021) equation, intended for patients 3 years of age and older. If gender is not documented or "unknown", there will be no eGFR calculation.   URINALYSIS, MICROSCOPIC - Abnormal; Notable for the following components:    MUCOUS Rare (*)     All other components within normal limits   C. DIFFICILE PCR - Normal   LIPASE - Normal   URINALYSIS, MACROSCOPIC - Normal   FECAL WHITE BLOOD CELLS   ROUTINE STOOL CULTURE (INCLUDING E. COLI SHIGA TOXIN)   CBC/DIFF    Narrative:     The following orders were created for panel order CBC/DIFF.  Procedure                               Abnormality  Status                     ---------                               -----------         ------                     CBC WITH GURK[[id number]]                                    Final result                 Please view results for these tests on the individual orders.   URINALYSIS, MACROSCOPIC AND MICROSCOPIC W/CULTURE  REFLEX    Narrative:     The following orders were created for panel order URINALYSIS, MACROSCOPIC AND MICROSCOPIC W/CULTURE REFLEX.  Procedure                               Abnormality         Status                     ---------                               -----------         ------                     URINALYSIS, MACROSCOPIC[[id number]]      Normal              Final result               URINALYSIS, MICROSCOPIC[[id number]]      Abnormal            Final result                 Please view results for these tests on the individual orders.   CBC WITH DIFF   BLUE TOP TUBE   GRAY TOP TUBE   EXTRA TUBES    Narrative:     The following orders were created for panel order EXTRA TUBES.  Procedure                               Abnormality         Status                     ---------                               -----------         ------                     BLUE TOP GBTD[[id number]]                                    Final result  GOLD TOP MMHW[[id number]]                                    In process                 LIGHT GREEN TOP TUBE[[id number]]                             In process                 GRAY TOP RXYV[[id number]]                                    Final result                 Please view results for these tests on the individual orders.   GOLD TOP TUBE   LIGHT GREEN TOP TUBE       No orders to display                Medical Decision Making  Amount and/or Complexity of Data Reviewed  Labs: ordered. Decision-making details documented in ED Course.    Risk  Prescription drug management.                     Medications Administered in the ED   potassium chloride 20 mEq in SW 100 mL premix infusion (has no administration in time range)   potassium chloride (K-DUR) extended release tablet (has no administration in time range)   NS bolus infusion 1,000 mL (0 mL Intravenous Stopped 12/30/21 0945)                    Clinical Impression   Diarrhea of presumed infectious origin (Primary)   Grief at loss of  child   Hyponatremia           Discharged      Current Discharge Medication List        START taking these medications    Details   diphenoxylate-atropine (LOMOTIL) 2.5-0.025 mg Oral Tablet Take 1 Tablet by mouth Every 6 hours as needed (diarrhea) for up to 20 doses  Qty: 20 Tablet, Refills: 0      metroNIDAZOLE (FLAGYL) 500 mg Oral Tablet Take 1 Tablet (500 mg total) by mouth Three times a day for 7 days  Qty: 21 Tablet, Refills: 0                     _______________________________  Darryll Capers, D.O.  Emergency Medicine  [place]          This note may have been partially generated using MModal Fluency Direct system, and there may be some incorrect words, spellings, and punctuation that were not noted in checking the note before saving, though effort was made to avoid such errors.

## 2021-12-30 NOTE — ED Triage Notes (Signed)
Abd pain since beginning of August. States diarrhea x2 weeks. Supposed to have EGD in September due to ulcers.

## 2021-12-30 NOTE — Discharge Instructions (Addendum)
Lomotil 2 tablets when you get home.  You can take 1 tablet every 6 hours needed for diarrhea   Flagyl 3 times a day  See Dr. Malen Gauze within 1 week   Return to the ER if there are any emergencies  You do need to recheck the potassium in 2 days.  A copy will be sent to Dr. Malen Gauze

## 2021-12-30 NOTE — ED Nurses Note (Signed)
Patient discharged home.  AVS reviewed with patient.  A written copy of the AVS and discharge instructions was given to the patient and order to come back to have labs redrawn to check potassium level.  Questions sufficiently answered as needed.  Patient encouraged to follow up with PCP as indicated.  In the event of an emergency, patient instructed to call 911 or go to the nearest emergency room. Patient left ED via ambulation.

## 2022-01-01 ENCOUNTER — Other Ambulatory Visit: Payer: Self-pay

## 2022-01-01 ENCOUNTER — Other Ambulatory Visit: Payer: BC Managed Care – PPO | Attending: Emergency Medicine

## 2022-01-01 DIAGNOSIS — E876 Hypokalemia: Secondary | ICD-10-CM | POA: Insufficient documentation

## 2022-01-01 DIAGNOSIS — R197 Diarrhea, unspecified: Secondary | ICD-10-CM | POA: Insufficient documentation

## 2022-01-01 LAB — BASIC METABOLIC PANEL
ANION GAP: 7 mmol/L (ref 4–13)
BUN/CREA RATIO: 13 (ref 6–22)
BUN: 8 mg/dL (ref 7–25)
CALCIUM: 9.2 mg/dL (ref 8.6–10.3)
CHLORIDE: 103 mmol/L (ref 98–107)
CO2 TOTAL: 29 mmol/L (ref 21–31)
CREATININE: 0.64 mg/dL (ref 0.60–1.30)
ESTIMATED GFR: 107 mL/min/{1.73_m2} (ref >59)
GLUCOSE: 94 mg/dL (ref 74–109)
OSMOLALITY, CALCULATED: 276 mOsm/kg (ref 270–290)
POTASSIUM: 2.8 mmol/L — ABNORMAL LOW (ref 3.5–5.1)
SODIUM: 139 mmol/L (ref 136–145)

## 2022-01-02 LAB — E. COLI SHIGA TOXIN
SHIGA TOXIN 1: NEGATIVE
SHIGA TOXIN 2: NEGATIVE

## 2022-01-04 LAB — ROUTINE STOOL CULTURE (INCLUDING E. COLI SHIGA TOXIN): STOOL CULTURE: NORMAL

## 2022-01-07 ENCOUNTER — Inpatient Hospital Stay
Admission: RE | Admit: 2022-01-07 | Discharge: 2022-01-07 | Disposition: A | Discharge status: Home / Self Care | Payer: BC Managed Care – PPO | Source: Ambulatory Visit | Attending: FAMILY PRACTICE | Admitting: FAMILY PRACTICE

## 2022-01-07 ENCOUNTER — Other Ambulatory Visit: Payer: Self-pay

## 2022-01-07 ENCOUNTER — Ambulatory Visit (HOSPITAL_COMMUNITY): Payer: Self-pay

## 2022-01-07 DIAGNOSIS — R002 Palpitations: Secondary | ICD-10-CM | POA: Insufficient documentation

## 2022-01-08 NOTE — Result Encounter Note (Signed)
Make sure patient had follow up w/ PCP. She was instructed to see Glenna Durand, DO

## 2022-01-08 NOTE — Result Encounter Note (Signed)
Already addressed w/ PCP followup

## 2022-01-13 ENCOUNTER — Other Ambulatory Visit (INDEPENDENT_AMBULATORY_CARE_PROVIDER_SITE_OTHER): Payer: Self-pay | Admitting: NURSE PRACTITIONER

## 2022-01-13 ENCOUNTER — Other Ambulatory Visit: Payer: Self-pay

## 2022-01-22 LAB — 7 DAY EXTENDED HOLTER MONITOR
Heart rate (average): 84 {beats}/min
Isolated SVE count: 541 episodes
Isolated VE Counts: 20 episodes
SVE Couplets Counts: 24 episodes
SVE Triplets Counts: 13 episodes
VE Triplets Counts: 1 episodes

## 2022-02-19 ENCOUNTER — Other Ambulatory Visit (HOSPITAL_COMMUNITY): Payer: Self-pay | Admitting: Obstetrics & Gynecology

## 2022-02-19 DIAGNOSIS — Z1239 Encounter for other screening for malignant neoplasm of breast: Secondary | ICD-10-CM

## 2022-02-28 ENCOUNTER — Inpatient Hospital Stay
Admission: RE | Admit: 2022-02-28 | Discharge: 2022-02-28 | Disposition: A | Discharge status: Home / Self Care | Payer: BC Managed Care – PPO | Source: Ambulatory Visit | Attending: Obstetrics & Gynecology | Admitting: Obstetrics & Gynecology

## 2022-02-28 ENCOUNTER — Encounter (HOSPITAL_COMMUNITY): Payer: Self-pay

## 2022-02-28 ENCOUNTER — Other Ambulatory Visit: Payer: Self-pay

## 2022-02-28 DIAGNOSIS — Z1231 Encounter for screening mammogram for malignant neoplasm of breast: Secondary | ICD-10-CM | POA: Insufficient documentation

## 2022-02-28 DIAGNOSIS — Z1239 Encounter for other screening for malignant neoplasm of breast: Secondary | ICD-10-CM

## 2022-07-10 ENCOUNTER — Other Ambulatory Visit (HOSPITAL_COMMUNITY): Payer: Self-pay | Admitting: FAMILY PRACTICE

## 2022-07-10 ENCOUNTER — Other Ambulatory Visit: Payer: Self-pay

## 2022-07-10 ENCOUNTER — Inpatient Hospital Stay
Admission: RE | Admit: 2022-07-10 | Discharge: 2022-07-10 | Disposition: A | Discharge status: Home / Self Care | Payer: BC Managed Care – PPO | Source: Ambulatory Visit | Attending: FAMILY PRACTICE | Admitting: FAMILY PRACTICE

## 2022-07-10 DIAGNOSIS — M79605 Pain in left leg: Secondary | ICD-10-CM

## 2022-07-10 DIAGNOSIS — M79604 Pain in right leg: Secondary | ICD-10-CM

## 2022-07-10 DIAGNOSIS — M79606 Pain in leg, unspecified: Secondary | ICD-10-CM | POA: Insufficient documentation

## 2022-09-04 ENCOUNTER — Inpatient Hospital Stay (HOSPITAL_COMMUNITY)
Admission: RE | Admit: 2022-09-04 | Payer: BC Managed Care – PPO | Source: Ambulatory Visit | Admitting: Obstetrics & Gynecology

## 2022-09-04 ENCOUNTER — Encounter (HOSPITAL_COMMUNITY): Admission: RE | Payer: Self-pay | Source: Ambulatory Visit

## 2022-09-04 SURGERY — HYSTERECTOMY  VAGINAL LAPAROSCOPIC ASSISTED
Anesthesia: General | Site: Vagina

## 2022-11-08 ENCOUNTER — Inpatient Hospital Stay
Admission: RE | Admit: 2022-11-08 | Discharge: 2022-11-08 | Disposition: A | Discharge status: Home / Self Care | Payer: BC Managed Care – PPO | Source: Ambulatory Visit | Attending: FAMILY PRACTICE | Admitting: FAMILY PRACTICE

## 2022-11-08 ENCOUNTER — Inpatient Hospital Stay (HOSPITAL_COMMUNITY)
Admission: RE | Admit: 2022-11-08 | Discharge: 2022-11-08 | Disposition: A | Discharge status: Home / Self Care | Payer: BC Managed Care – PPO | Source: Ambulatory Visit

## 2022-11-08 ENCOUNTER — Encounter (HOSPITAL_COMMUNITY): Payer: Self-pay | Admitting: FAMILY PRACTICE

## 2022-11-08 ENCOUNTER — Other Ambulatory Visit (HOSPITAL_COMMUNITY): Payer: Self-pay | Admitting: FAMILY PRACTICE

## 2022-11-08 ENCOUNTER — Other Ambulatory Visit: Payer: Self-pay

## 2022-11-08 DIAGNOSIS — M79605 Pain in left leg: Secondary | ICD-10-CM

## 2023-01-07 ENCOUNTER — Other Ambulatory Visit (HOSPITAL_COMMUNITY): Payer: Self-pay | Admitting: FAMILY PRACTICE

## 2023-01-07 DIAGNOSIS — G4710 Hypersomnia, unspecified: Secondary | ICD-10-CM

## 2023-01-14 ENCOUNTER — Inpatient Hospital Stay
Admission: RE | Admit: 2023-01-14 | Discharge: 2023-01-14 | Disposition: A | Discharge status: Home / Self Care | Payer: BC Managed Care – PPO | Source: Ambulatory Visit | Attending: FAMILY PRACTICE | Admitting: FAMILY PRACTICE

## 2023-01-14 ENCOUNTER — Other Ambulatory Visit: Payer: Self-pay

## 2023-01-14 DIAGNOSIS — R0683 Snoring: Secondary | ICD-10-CM

## 2023-01-14 DIAGNOSIS — G4710 Hypersomnia, unspecified: Secondary | ICD-10-CM | POA: Insufficient documentation

## 2023-01-20 NOTE — Procedures (Signed)
[facility]    Home Sleep Study    Patient Name: Vicki Orr  Date of Birth: 02/08/1971  Gender: female  Provider: Lubertha Sayres, DO  Location: [facility]  Location Address: [address] [place], [address]  [facility]  Location Phone: [phone] [facility]      Primary Care Provider: Tamsen Roers, DO    Procedure Home Sleep Test     Past Medical History  Past Medical History:   Diagnosis Date    GERD (gastroesophageal reflux disease)     HTN (hypertension)     Neuropathy (CMS HCC)     Venous (peripheral) insufficiency            Past Surgical History  Past Surgical History:   Procedure Laterality Date    BREAST IMPLANT REMOVAL Bilateral     ENDOMETRIAL ABLATION      ESOPHAGOSCOPY / EGD      x 2 since January 2023    HX BREAST IMPLANT Bilateral     HX CHOLECYSTECTOMY      HX TUBAL LIGATION             Medication LIst  No outpatient medications have been marked as taking for the 01/14/23 encounter [facility] Encounter) with SLEEP LAB RM 1 PRN.       Allergy List  Allergies   Allergen Reactions    Percocet [Oxycodone] Itching       Family Medical History  Family Medical History:    None           Social History  Social History     Socioeconomic History    Marital status: Married   Tobacco Use    Smoking status: Never    Smokeless tobacco: Never   Vaping Use    Vaping status: Never Used   Substance and Sexual Activity    Alcohol use: Never    Drug use: Never    Sexual activity: Not Currently     Social Determinants of Health      Received from [facility]    Intimate Partner Violence         Patient Information  Sleep apnea symptoms: Excessive Daytime Sleepiness  Patient's Height: 62 inches  Patient's Weight: 149.0  Patient's BMI: 27.2  Neck Circumference: Not Available  Epworth Scale Score: 3      Date of Study: 01/14/2023  Study Performed By: Ula Lingo, RRT, RPSGT, [facility]  Study Reviewed By: Ula Lingo, RRT, RPSGT, [facility]    Procedure: Home Sleep study was Performed using:  Alice night one. Channels recorded: Airflow acquired by using a nasal pressure cannula, oxygen saturation (SpO2) was monitored using a pulse oximeter, respiratory effort, body position, snoring sound , and heart rate    Sleep Architecture: Total Recording Time: 481.0 min.    Respiratory Data: Number of Obstructive Apneas Observed: 1. Number of Central Apneas Observed: 0. Number of Mixed Apneas Observed: 0. Total Number of Apneas: 1. Total number of Hypopneas: 0.0. Apnea/Hypoapnea Index (AHI) per hour: 0.1. Percent of Time Patient Spent in Supine Position: 382.6. AHI in Supine Position #: 0.2. Non-supine AHI #: 0.0. Lowest Desaturation Percentage: 83%. Total Amount of Desaturated Time Below or Equal  to 83 minutes. Snoring During the Study: Status: Yes.     The study was scored by: 30 second EPOCH, 4 % desaturation and  CMS guidelines.AASM Accredited     Cardiac Data:  The Mean Heart Rate During the Study (Beats/Min): 71.8.  Assessment:   Diagnosis:   (R06.83) Snoring (SCT)    PLAN  Procedure Orders    No significant     Instructions:   Positional Therapy may be helpful in snoring and Nasal steroids may be helpful for primary snoring    Impression:  Ahi 0.1 per hour No OSA     Eskay Ula Lingo, RRT, RPSGT, [facility]        Raw data reviewed.  Mild OSA noted but does not meet criteria for treatment.  F/U in office as scheduled.  Lubertha Sayres, DO   01/27/2023  14:16

## 2023-02-05 IMAGING — MR MRI BRAIN W/O CONTRAST
8 of 9 series · 37 of 48 positions shown · IV contrast (gadolinium)
Comparison: MRI brain dated 04/22/2020.

﻿EXAM:  MRI BRAIN W/O CONTRAST
INDICATION: 52-year-old with tingling and numbness and swelling of the extremities.
TECHNIQUE: Multiplanar, multisequential MRI of the brain was performed without gadolinium contrast.

[Series 5: DWI · axial · 5.5mm · 1.35mm/px · z∈[-83,+50]mm · 10 of 92 slices shown (1 of 3)]
[im 7/92]
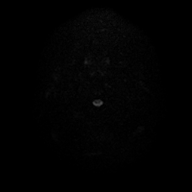
[im 13/92]
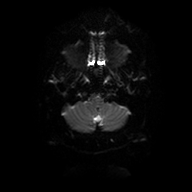
[im 19/92]
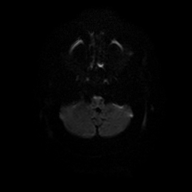
[im 31/92]
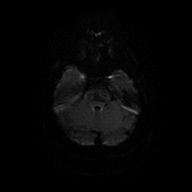
[im 43/92]
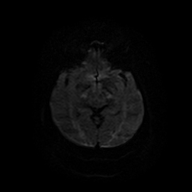
[im 49/92]
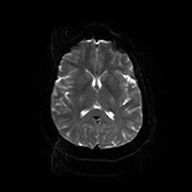
[im 55/92]
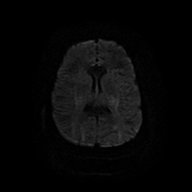
[im 67/92]
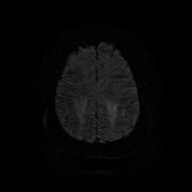
[im 79/92]
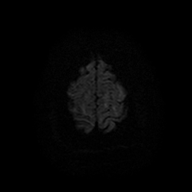
[im 92/92]
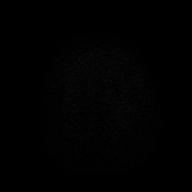

[Series 6: DWI · axial · 5.5mm · 1.35mm/px · z∈[-90,+50]mm · 4 of 23 slices shown (2 of 3)]
[im 1/23]
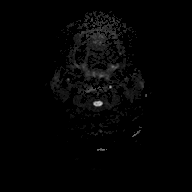
[im 8/23]
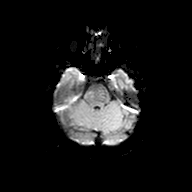
[im 15/23]
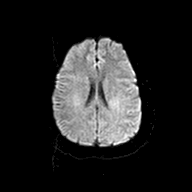
[im 23/23]
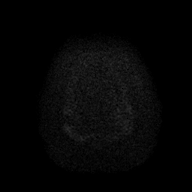

[Series 7: DWI · axial · 5.5mm · 1.35mm/px · z∈[-90,+50]mm · 4 of 23 slices shown (3 of 3)]
[im 1/23]
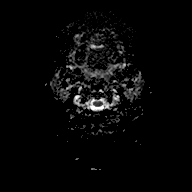
[im 8/23]
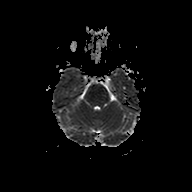
[im 15/23]
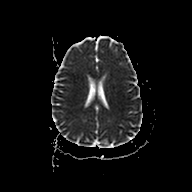
[im 23/23]
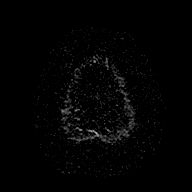

[Series 8: T2 · axial · 5.0mm · 0.43mm/px · z∈[-86,+55]mm · 4 of 25 slices shown (1 of 2)]
[im 1/25]
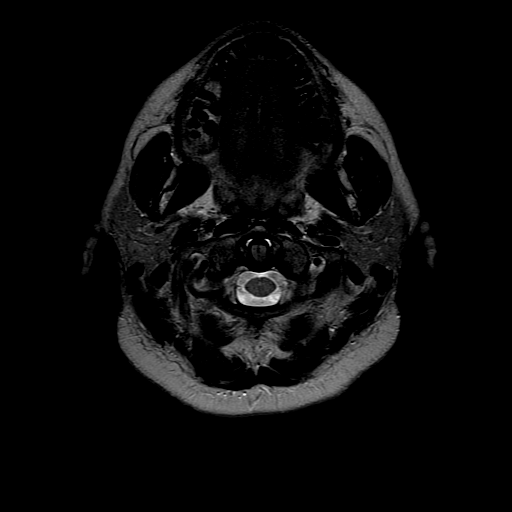
[im 9/25]
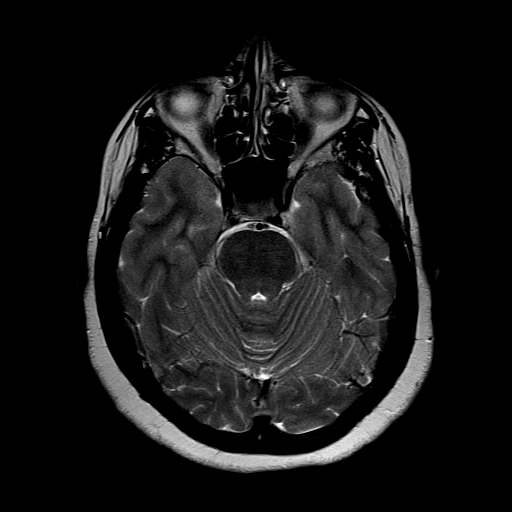
[im 17/25]
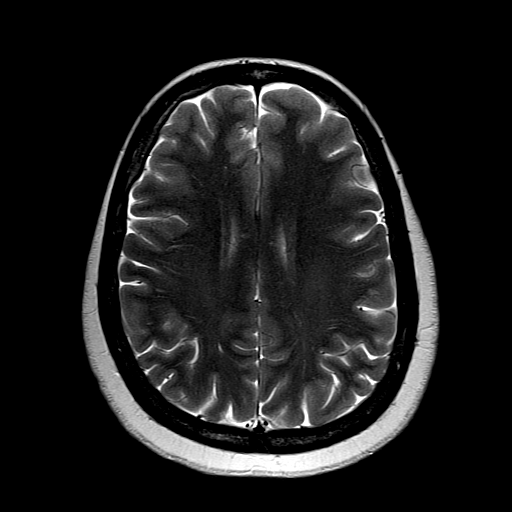
[im 25/25]
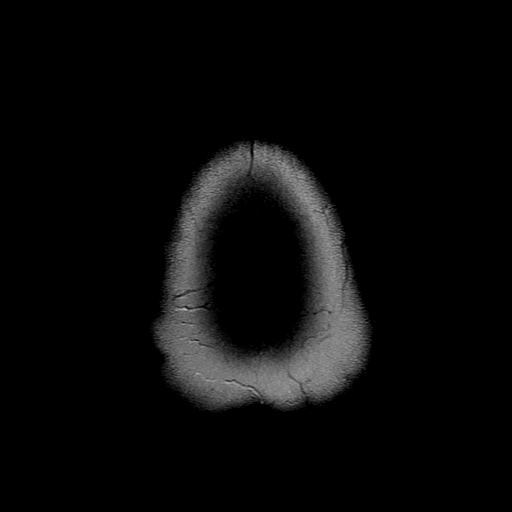

[Series 9: FLAIR · axial · 5.0mm · 0.43mm/px · z∈[-86,+55]mm · 4 of 25 slices shown (1 of 2)]
[im 1/25]
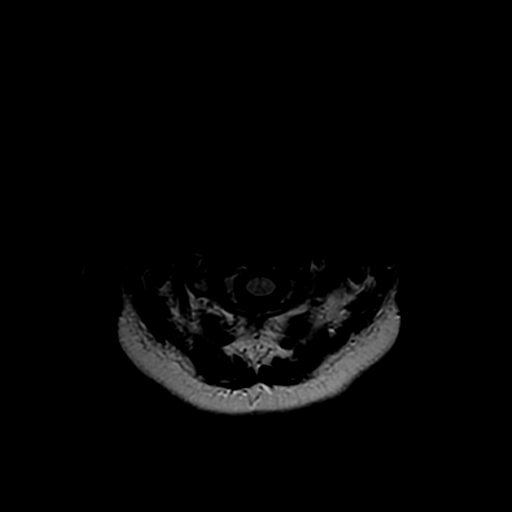
[im 9/25]
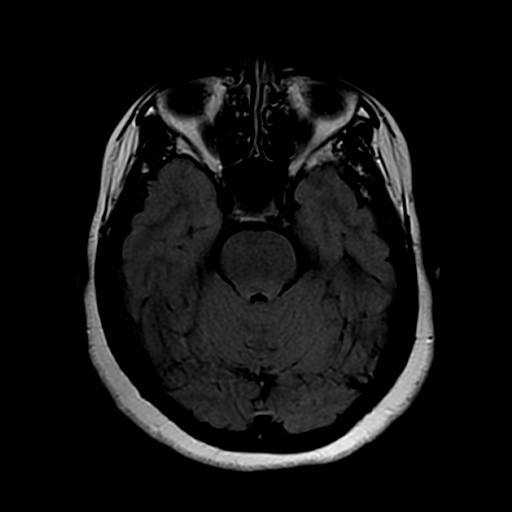
[im 17/25]
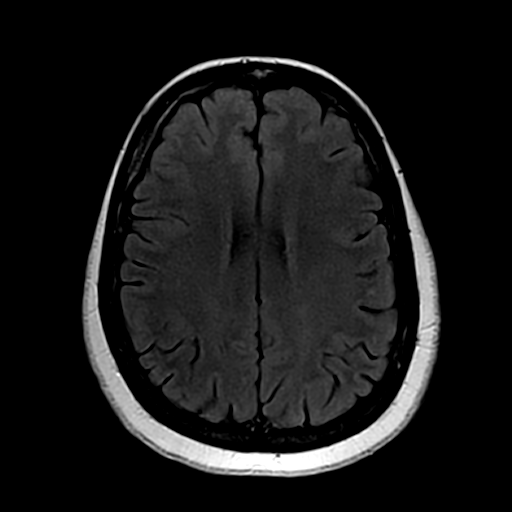
[im 25/25]
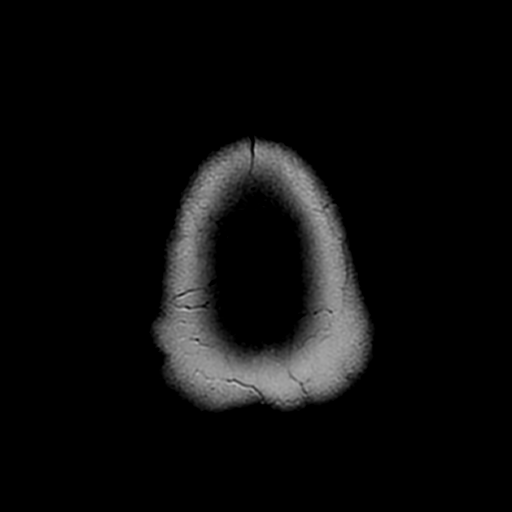

[Series 10: T1 · axial · 5.0mm · 0.43mm/px · z∈[-86,+8]mm · 3 of 25 slices shown]
[im 1/25]
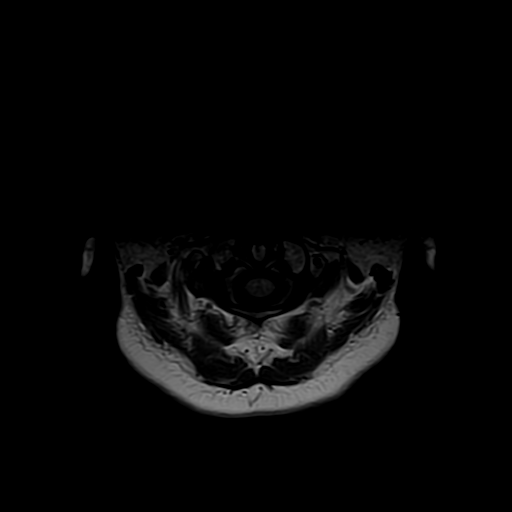
[im 9/25]
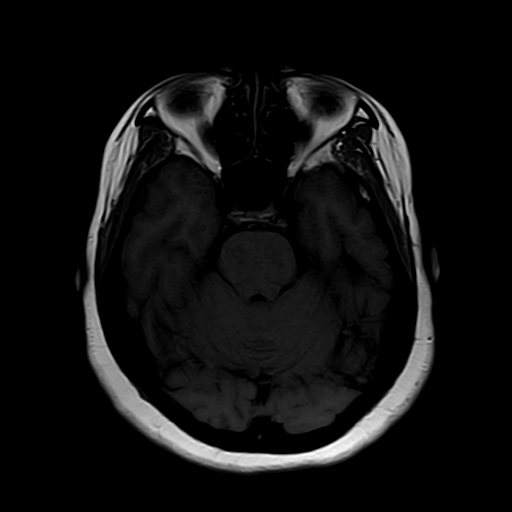
[im 17/25]
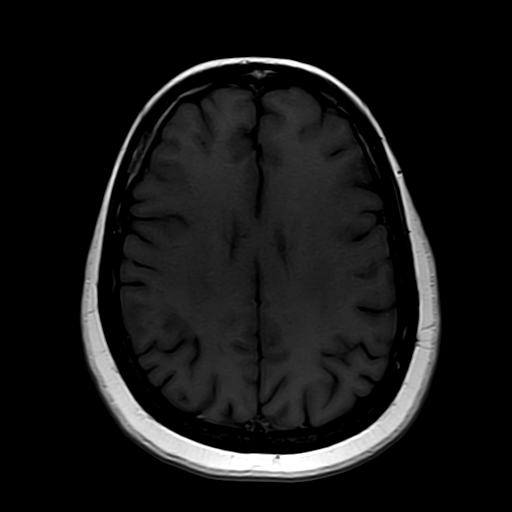

[Series 12: FLAIR · sagittal · 4.5mm · 0.75mm/px · 4 of 25 slices shown (2 of 2)]
[im 1/25]
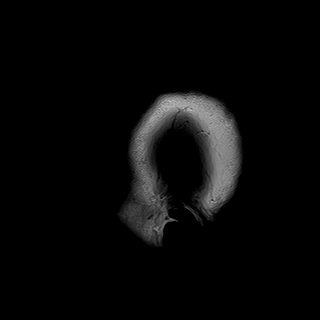
[im 9/25]
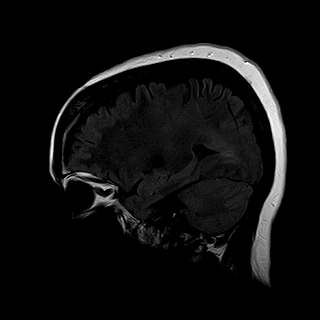
[im 17/25]
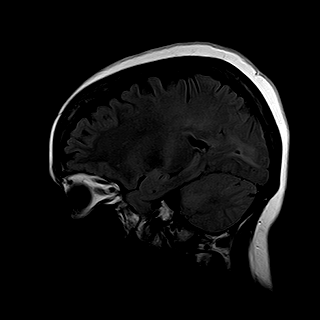
[im 25/25]
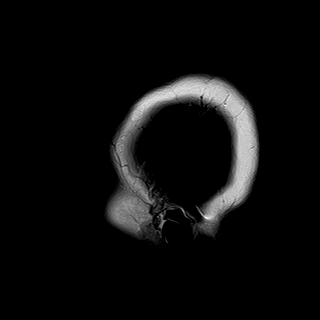

[Series 13: T2 · coronal · 6.0mm · 0.43mm/px · 4 of 25 slices shown (2 of 2)]
[im 1/25]
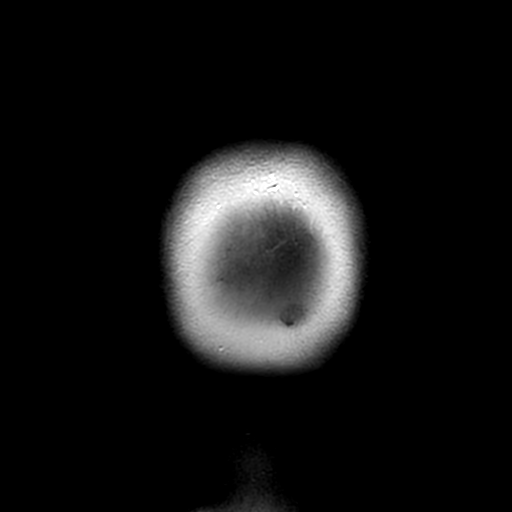
[im 9/25]
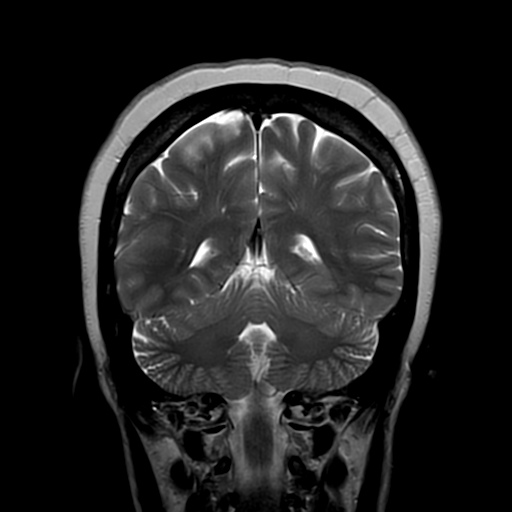
[im 17/25]
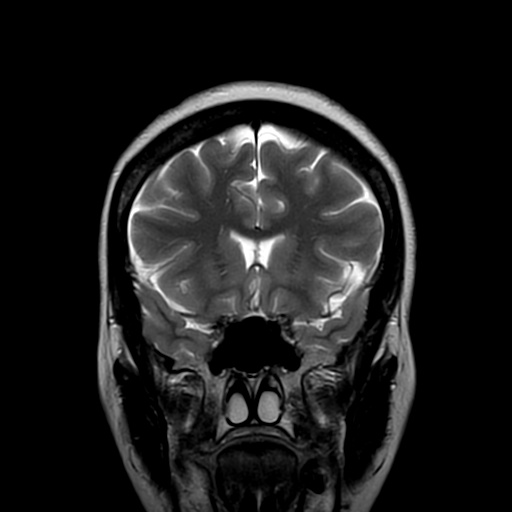
[im 25/25]
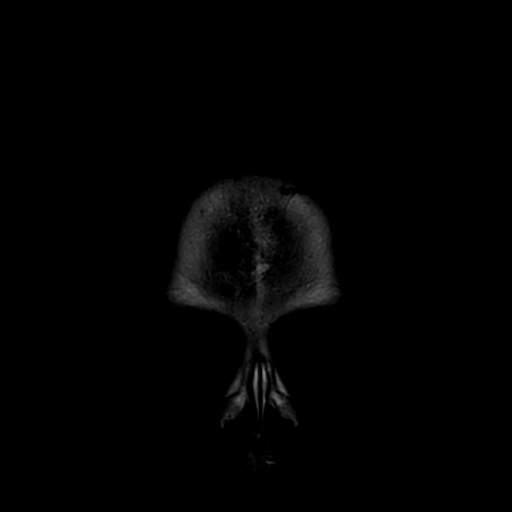

[37 of 48 positions shown; findings below may reference images not displayed]

FINDINGS: No focal areas of restricted diffusion on the diffusion sequence. 

The FLAIR images show no evidence of demyelinating process or chronic ischemic change.  No intracranial space-occupying lesions or ventriculomegaly are seen.  No midline shift.  Major vascular structures of circle of Willis are patent.
IMPRESSION: 1. No intracranial space-occupying lesions or ventriculomegaly.

2. No evidence of acute ischemia, chronic ischemia or demyelinating lesions of the brain.

## 2023-02-26 ENCOUNTER — Other Ambulatory Visit (HOSPITAL_COMMUNITY): Payer: Self-pay

## 2023-02-26 DIAGNOSIS — Z1239 Encounter for other screening for malignant neoplasm of breast: Secondary | ICD-10-CM

## 2023-03-17 ENCOUNTER — Ambulatory Visit
Admission: RE | Admit: 2023-03-17 | Discharge: 2023-03-17 | Disposition: A | Discharge status: Home / Self Care | Payer: BC Managed Care – PPO | Source: Ambulatory Visit

## 2023-03-17 ENCOUNTER — Other Ambulatory Visit: Payer: Self-pay

## 2023-03-17 ENCOUNTER — Encounter (HOSPITAL_COMMUNITY): Payer: Self-pay

## 2023-03-17 DIAGNOSIS — Z1231 Encounter for screening mammogram for malignant neoplasm of breast: Secondary | ICD-10-CM | POA: Insufficient documentation

## 2023-03-17 DIAGNOSIS — Z1239 Encounter for other screening for malignant neoplasm of breast: Secondary | ICD-10-CM

## 2023-09-28 ENCOUNTER — Emergency Department
Admission: EM | Admit: 2023-09-28 | Discharge: 2023-09-28 | Disposition: A | Discharge status: Home / Self Care | Attending: Family Medicine | Admitting: Family Medicine

## 2023-09-28 ENCOUNTER — Other Ambulatory Visit: Payer: Self-pay

## 2023-09-28 ENCOUNTER — Encounter (HOSPITAL_COMMUNITY): Payer: Self-pay | Admitting: Family Medicine

## 2023-09-28 ENCOUNTER — Emergency Department (HOSPITAL_COMMUNITY)

## 2023-09-28 DIAGNOSIS — M7122 Synovial cyst of popliteal space [Baker], left knee: Secondary | ICD-10-CM

## 2023-09-28 DIAGNOSIS — R109 Unspecified abdominal pain: Secondary | ICD-10-CM

## 2023-09-28 DIAGNOSIS — I872 Venous insufficiency (chronic) (peripheral): Secondary | ICD-10-CM

## 2023-09-28 DIAGNOSIS — M1712 Unilateral primary osteoarthritis, left knee: Secondary | ICD-10-CM | POA: Insufficient documentation

## 2023-09-28 LAB — CBC WITH DIFF
BASOPHIL #: 0.1 10*3/uL (ref 0.00–0.10)
BASOPHIL %: 1 % (ref 0–1)
EOSINOPHIL #: 0.2 10*3/uL (ref 0.00–0.50)
EOSINOPHIL %: 3 % (ref 1–7)
HCT: 41.5 % (ref 31.2–41.9)
HGB: 14.1 g/dL (ref 10.9–14.3)
LYMPHOCYTE #: 3 10*3/uL (ref 1.10–3.10)
LYMPHOCYTE %: 47 % — ABNORMAL HIGH (ref 16–46)
MCH: 29.3 pg (ref 24.7–32.8)
MCHC: 34 g/dL (ref 32.3–35.6)
MCV: 86.2 fL (ref 75.5–95.3)
MONOCYTE #: 0.4 10*3/uL (ref 0.20–0.90)
MONOCYTE %: 6 % (ref 4–11)
MPV: 8.4 fL (ref 7.9–10.8)
NEUTROPHIL #: 2.8 10*3/uL (ref 1.90–8.20)
NEUTROPHIL %: 44 % (ref 43–77)
PLATELETS: 292 10*3/uL (ref 140–440)
RBC: 4.82 10*6/uL (ref 3.63–4.92)
RDW: 13.7 % (ref 12.3–17.7)
WBC: 6.4 10*3/uL (ref 3.8–11.8)

## 2023-09-28 LAB — URINALYSIS, MACROSCOPIC
BILIRUBIN: NEGATIVE mg/dL
BLOOD: NEGATIVE mg/dL
GLUCOSE: NEGATIVE mg/dL
KETONES: NEGATIVE mg/dL
LEUKOCYTES: NEGATIVE WBCs/uL
NITRITE: NEGATIVE
PH: 6 (ref 5.0–9.0)
PROTEIN: NEGATIVE mg/dL
SPECIFIC GRAVITY: 1.003 (ref 1.002–1.030)
UROBILINOGEN: NORMAL mg/dL

## 2023-09-28 LAB — COMPREHENSIVE METABOLIC PANEL, NON-FASTING
ALBUMIN/GLOBULIN RATIO: 1.7 — ABNORMAL HIGH (ref 0.8–1.4)
ALBUMIN: 4.3 g/dL (ref 3.5–5.7)
ALKALINE PHOSPHATASE: 72 U/L (ref 34–104)
ALT (SGPT): 15 U/L (ref 7–52)
ANION GAP: 7 mmol/L (ref 4–13)
AST (SGOT): 18 U/L (ref 13–39)
BILIRUBIN TOTAL: 0.4 mg/dL (ref 0.3–1.0)
BUN/CREA RATIO: 21 (ref 6–22)
BUN: 15 mg/dL (ref 7–25)
CALCIUM, CORRECTED: 8.9 mg/dL (ref 8.9–10.8)
CALCIUM: 9.1 mg/dL (ref 8.6–10.3)
CHLORIDE: 110 mmol/L — ABNORMAL HIGH (ref 98–107)
CO2 TOTAL: 21 mmol/L (ref 21–31)
CREATININE: 0.71 mg/dL (ref 0.60–1.30)
ESTIMATED GFR: 102 mL/min/{1.73_m2} (ref >59)
GLOBULIN: 2.6 (ref 2.0–3.5)
GLUCOSE: 84 mg/dL (ref 74–109)
OSMOLALITY, CALCULATED: 276 mosm/kg (ref 270–290)
POTASSIUM: 4.2 mmol/L (ref 3.5–5.1)
PROTEIN TOTAL: 6.9 g/dL (ref 6.4–8.9)
SODIUM: 138 mmol/L (ref 136–145)

## 2023-09-28 LAB — PTT (PARTIAL THROMBOPLASTIN TIME): APTT: 31.8 s (ref 25.0–38.0)

## 2023-09-28 LAB — PT/INR
INR: 0.98 (ref 0.84–1.10)
PROTHROMBIN TIME: 11.1 s (ref 9.8–12.7)

## 2023-09-28 LAB — URINALYSIS, MICROSCOPIC
RBCS: <1 /HPF (ref <4)
SQUAMOUS EPITHELIAL: 3 /HPF (ref <28)
WBCS: <1 /HPF (ref <6)

## 2023-09-28 LAB — B-TYPE NATRIURETIC PEPTIDE (BNP),PLASMA: BNP: 70 pg/mL (ref 1–100)

## 2023-09-28 LAB — D-DIMER: D-DIMER: <215 ng{FEU}/mL — ABNORMAL LOW (ref 215–500)

## 2023-09-28 NOTE — ED Provider Notes (Signed)
 [facility]  ED Primary Provider Note  Patient Name: Vicki Orr  Patient Age: 53 y.o.  Date of Birth: Sep 12, 1970    Chief Complaint: Leg Swelling        History of Present Illness       Vicki Orr is a 53 y.o. female who had concerns including Leg Swelling.  PATIENT PRESENTED TO THE EMERGENCY DEPARTMENT WITH COMPLAINTS OF SOME SWELLING IN HER LEGS.  PATIENT STATES THAT SHE HAS CHRONIC VENOUS INSUFFICIENCY AND HAS NOTICED SOME CHANGES IN HER LEGS SINCE HER 2ND DOSE OF ZEPBOUND LAST WEEK.  SHE STATES THAT SHE JUST STARTED THE MEDICATION 2 WEEKS AGO.  SHE STATES THAT SHE HAS SWELLING BEHIND HER LEFT KNEE THAT REALLY GOT WORSE YESTERDAY.  SHE DENIES ANY TENDERNESS TO PALPATION OR INCREASED WARMTH TO PALPATION.  SHE DENIES ANY CALF TENDERNESS OR SWELLING.  PATIENT STATES THAT SHE DID HAVE SOME PAIN IN HER THIGH PREVIOUSLY, BUT THAT HAS RESOLVED.  SHE DENIES ANY HISTORY OF BLOOD CLOTS.  PATIENT STATES THAT SHE HAS ALSO NOTICED SOME ABNORMAL AREAS ON THE BACK OF HER RIGHT CALF WITH "POCKETS" THAT HAVE APPEARED.  SHE DENIES ANY TENDERNESS TO PALPATION IN HIS AREA AS WELL.  SHE DENIES ANY INJURY OR TRAUMA TO THE AREA.  SHE DENIES ANY CHEST PAIN OR SHORTNESS OF BREATH, FEVERS/CHILLS OR RECENT CHANGES IN BOWEL HABITS.  SHE DOES ADMIT TO SOME INTERMITTENT LEFT-SIDED FLANK PAIN AND MENTIONED THAT SHE WAS WORRIED THAT SHE MIGHT BE DEVELOPING URINARY TRACT INFECTION BECAUSE OF THAT, BUT DENIES ANY BURNING WITH URINATION OR CHANGES IN FREQUENCY, FOUL ODOR OR DISCOLORATION.  AT TIME OF EXAMINATION, PATIENT WAS RESTING COMFORTABLY AND IN NO APPARENT DISTRESS.  SHE DENIES ANY FURTHER COMPLAINTS.        Review of Systems     No other overt Review of Systems are noted to be positive except noted in the HPI.      Historical Data   History Reviewed This Encounter: Medical History  Surgical History  Family History  Social History      Physical Exam   ED Triage Vitals   BP (Non-Invasive) 09/28/23  0854 129/82   Heart Rate 09/28/23 0853 93   Respiratory Rate 09/28/23 0853 18   Temperature 09/28/23 0853 36.4 C (97.6 F)   SpO2 09/28/23 0853 98 %   Weight 09/28/23 0853 68 kg (150 lb)   Height 09/28/23 0853 1.575 m (5\' 2" )         Nursing notes reviewed for what could be assessed. Past Medical, Surgical, and Social history reviewed for what has been completed.     Constitutional: NAD. Well-Developed. Well Nourished.  AFEBRILE.  Head: Normocephalic, atraumatic.  Mouth/Throat: no nasal discharge  Eyes: EOM grossly intact, conjunctiva normal.  Cardiovascular: Regular Rate and Rhythm, extremities well perfused.  Pulmonary/Chest: No respiratory distress. Lungs are symmetric to auscultation bilaterally.  Abdominal: Soft, non-tender, non-distended.   MSK:  MINIMAL EDEMA BILATERAL LOWER EXTREMITIES THAT PATIENT STATES IS CHRONIC.  PATIENT HAS SWELLING ALONG THE POSTERIOR ASPECT OF THE LEFT KNEE CONSISTENT WITH BAKER'S CYST.  THERE WAS NO INCREASED WARMTH TO PALPATION, EVIDENCE OF CELLULITIS OR TENDERNESS TO PALPATION.  NO TENDERNESS TO PALPATION OF THE LEFT CALF OR THE LEFT THIGH.  PULSES INTACT DISTALLY.  THERE ARE SOME AREAS THAT ARE MOST LIKELY THE RESULT OF CELLULITIS IN THE RIGHT CALF.  THERE WAS NO DISCOLORATION, INCREASED WARMTH TO PALPATION, ERYTHEMA OR TENDERNESS TO PALPATION IN HIS AREA.  PULSES  INTACT DISTALLY.  Skin: Warm, dry, and intact.  NO EVIDENCE OF CELLULITIS OR RASH NOTED ON EXAMINATION.  Neuro: Appropriate, CN II-XII grossly intact   Psych: Cooperative           Procedures      Patient Data     Labs Ordered/Reviewed   COMPREHENSIVE METABOLIC PANEL, NON-FASTING - Abnormal; Notable for the following components:       Result Value    CHLORIDE 110 (*)     ALBUMIN/GLOBULIN RATIO 1.7 (*)     All other components within normal limits    Narrative:     Estimated Glomerular Filtration Rate (eGFR) is calculated using the CKD-EPI (2021) equation, intended for patients 90 years of age and older. If gender is  not documented or "unknown", there will be no eGFR calculation.     CBC WITH DIFF - Abnormal; Notable for the following components:    LYMPHOCYTE % 47 (*)     All other components within normal limits   D-DIMER - Abnormal; Notable for the following components:    D-DIMER <215 (*)     All other components within normal limits    Narrative:     D-Dimers are reported in FEU per ng/mL.    IF PATIENT IS EXHIBITING SYMPTOMS DVT/PE, THIS D-DIMER RESULT MAY INDICATE A NEED FOR FURTHER TESTING FOR THESE CONDITIONS. IF PATIENT IS SUSPECTED OF DIC AND SYMPTOMS WORSEN OR PERSIST, A REPEAT DIC WORKUP SHOULD BE CONSIDERED.    NOTE: ALTHOUGH THE NORMAL RANGE FOR THIS TEST IS 215-500 ng/mL FEU, LITERATURE RECOMMENDS FURTHER TESTING FOR ANY RESULT >500ng/mL FEU.    A cut off value of 500ng/mL FEU or below can be used as an aid in the diagnosis of Thromboembolism when used in conjunction with the patient's medical history, clinical presentation and other findings. Results of 500ng/mL FEU or below have a negative predictive value of 100%.     B-TYPE NATRIURETIC PEPTIDE (BNP),PLASMA - Normal    Narrative:                                 Class 1: 101-250 pg/mL                              Class 2: 251-550 pg/mL                              Class 3: 551-900 pg/mL                              Class 4: >901 pg/mL     The [place]  [employer] has developed a four-stage functional classification system for CHF that is based on a subjective interpretation of the severity of a patient's clinical signs and symptoms.    Class 1 - Patients have no limitations on physical activity and have no symptoms with ordinary physical activity.    Class 2 - Patients have a slight limitation of physical activity and have symptoms with ordinary physical activity.    Class 3 - Patients have a marked limitation of physical activity and have symptoms with less than ordinary physical activity, but not at rest.    Class 4 - Patients are unable to perform any  physical activity without discomfort.   PTT (  PARTIAL THROMBOPLASTIN TIME) - Normal   PT/INR - Normal    Narrative:     In the setting of warfarin therapy, a moderate-intensity INR goal range is 2.0 to 3.0 and a high-intensity INR goal range is 2.5 to 3.5.    INR is ONLY validated to determine the level of anticoagulation with vitamin K antagonists (warfarin). Other factors may elevate the INR including but not limited to direct oral anticoagulants (DOACs), liver dysfunction, vitamin K deficiency, DIC, factor deficiencies, and factor inhibitors.   URINALYSIS, MACROSCOPIC - Normal   URINALYSIS, MICROSCOPIC - Normal   CBC/DIFF    Narrative:     The following orders were created for panel order CBC/DIFF.  Procedure                               Abnormality         Status                     ---------                               -----------         ------                     CBC WITH JOAC[[id number]]                Abnormal            Final result                 Please view results for these tests on the individual orders.   URINALYSIS, MACROSCOPIC AND MICROSCOPIC W/CULTURE REFLEX    Narrative:     The following orders were created for panel order URINALYSIS, MACROSCOPIC AND MICROSCOPIC W/CULTURE REFLEX [PRN ONLY].  Procedure                               Abnormality         Status                     ---------                               -----------         ------                     URINALYSIS, MACROSCOPIC[[id number]]      Normal              Final result               URINALYSIS, MICROSCOPIC[[id number]]      Normal              Final result                 Please view results for these tests on the individual orders.       XR KNEE LEFT 4 OR MORE VIEWS   Final Result by Edi, Radresults In (05/18 1001)   Mild DJD.                      Radiologist location ID: [id number]  Medical Decision Making          Medical Decision Making        Studies Assessed:  LAB WORK AND IMAGING        MDM Narrative:  PATIENT  PRESENTED TO THE EMERGENCY DEPARTMENT WITH COMPLAINTS OF SOME SWELLING IN HER LEGS.  PATIENT STATES THAT SHE HAS CHRONIC VENOUS INSUFFICIENCY AND HAS NOTICED SOME CHANGES IN HER LEGS SINCE HER 2ND DOSE OF ZEPBOUND LAST WEEK.  SHE STATES THAT SHE JUST STARTED THE MEDICATION 2 WEEKS AGO.  SHE STATES THAT SHE HAS SWELLING BEHIND HER LEFT KNEE THAT REALLY GOT WORSE YESTERDAY.  SHE DENIES ANY TENDERNESS TO PALPATION OR INCREASED WARMTH TO PALPATION.  SHE DENIES ANY CALF TENDERNESS OR SWELLING.  PATIENT STATES THAT SHE DID HAVE SOME PAIN IN HER THIGH PREVIOUSLY, BUT THAT HAS RESOLVED.  SHE DENIES ANY HISTORY OF BLOOD CLOTS.  PATIENT STATES THAT SHE HAS ALSO NOTICED SOME ABNORMAL AREAS ON THE BACK OF HER RIGHT CALF WITH "POCKETS" THAT HAVE APPEARED.  SHE DENIES ANY TENDERNESS TO PALPATION IN HIS AREA AS WELL.  SHE DENIES ANY INJURY OR TRAUMA TO THE AREA.  SHE DENIES ANY CHEST PAIN OR SHORTNESS OF BREATH, FEVERS/CHILLS OR RECENT CHANGES IN BOWEL HABITS.  SHE DOES ADMIT TO SOME INTERMITTENT LEFT-SIDED FLANK PAIN AND MENTIONED THAT SHE WAS WORRIED THAT SHE MIGHT BE DEVELOPING URINARY TRACT INFECTION BECAUSE OF THAT, BUT DENIES ANY BURNING WITH URINATION OR CHANGES IN FREQUENCY, FOUL ODOR OR DISCOLORATION.  AT TIME OF EXAMINATION, PATIENT WAS RESTING COMFORTABLY AND IN NO APPARENT DISTRESS.  SHE DENIES ANY FURTHER COMPLAINTS.  PHYSICAL EXAMINATION REVEALS SWELLING ALONG THE POSTERIOR ASPECT OF THE LEFT KNEE CONSISTENT WITH A BAKER'S CYST.  THERE WAS NO TENDERNESS TO PALPATION, ERYTHEMA OR INCREASED WARMTH TO PALPATION.  THERE WAS NO TENDERNESS TO PALPATION OF THE LEFT CALF AND PEDAL PULSES INTACT DISTALLY.  THERE WAS NO TENDERNESS TO PALPATION OF THE LEFT THIGH NOTED ON EXAMINATION.  NO EVIDENCE OF CELLULITIS OR CONCERNING EVIDENCE FOR DEEP VEIN THROMBOSIS AT THIS TIME.  THERE WAS NO TENDERNESS TO PALPATION OF THE RIGHT CALF.  THERE IS SOME EVIDENCE OF POSSIBLE CELLULITIS IN THE AREA THAT SHE IS REFERRING TO WITH THE POCKETS.   THERE WAS NO DISCOLORATION, ERYTHEMA OR INCREASED WARMTH TO PALPATION.  PULSES INTACT DISTALLY.  HEART AND LUNG EXAMINATION WITHIN NORMAL LIMITS.  PATIENT WAS RESTING COMFORTABLY.  LAB WORK AND IMAGING WERE ORDERED.  PATIENT WAS STABLE.      ED Course as of 09/28/23 1011   Sun Sep 28, 2023   0933 WBC 6.4, HEMOGLOBIN 14.1, PLATELET COUNT 292   0933 URINALYSIS WAS UNREMARKABLE FOR ANY EVIDENCE OF URINARY TRACT INFECTION OR HEMATURIA   0946 PTT 31.8, PT 11.1, INR 0.98, D-DIMER LESS THAN 215   0946 ON REEXAMINATION, PATIENT WAS RESTING COMFORTABLY AND IN NO APPARENT DISTRESS.  SHE WAS COUNSELED AND EDUCATED ON LABORATORY FINDINGS TO THIS POINT.  ALL QUESTIONS WERE ANSWERED TO SATISFACTION.  PATIENT WAS STABLE.   1000 SODIUM 138, POTASSIUM 4.2, BUN 15, CREATININE 0.71, GLUCOSE 84, TOTAL BILIRUBIN 0.4, AST 18, ALT 15, TOTAL ALKALINE PHOSPHATASE 72   1000 BNP 70   1001 X-RAY IMAGING OF THE LEFT KNEE REVEALED:   FINDINGS:   No fracture.  No suspicious bone lesion.  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  No effusion.  Soft tissues are unremarkable.        IMPRESSION:  Mild DJD.     ON REEXAMINATION, PATIENT  WAS RESTING COMFORTABLY AND IN NO APPARENT DISTRESS.  PATIENT WAS COUNSELED AND EDUCATED ON LABORATORY AND IMAGING FINDINGS.  ALL QUESTIONS WERE ANSWERED TO SATISFACTION.  PATIENT WAS COUNSELED AND EDUCATED ON SUPPORTIVE CARE AT HOME, INCLUDING COMPRESSION STOCKINGS.  SHE WAS INSTRUCTED TO FOLLOW UP WITH PCP IN THE NEXT 1-2 DAYS TO RECHECK SYMPTOMS AND WAS GIVEN VERY CLEAR INSTRUCTIONS TO RETURN TO THE EMERGENCY DEPARTMENT FOR ANY NEW OR WORSENING SYMPTOMS.  PATIENT VERBALIZED UNDERSTANDING.  PATIENT WAS SMILING AND STABLE AT TIME OF DISCHARGE.  ALL QUESTIONS WERE ANSWERED TO SATISFACTION.         Following the history, physical exam, and ED workup, the patient was deemed stable and suitable for discharge. The patient/caregiver was advised to return to the ED for any new or worsening symptoms.  Discharge medications, and follow-up instructions were discussed with the patient/caregiver in detail, who verbalizes understanding. The patient/caregiver is in agreement and is comfortable with the plan of care.    Disposition: Discharged         Current Discharge Medication List        CONTINUE these medications - NO CHANGES were made during your visit.        Details   diphenoxylate -atropine  2.5-0.025 mg Tablet  Commonly known as: LOMOTIL    1 Tablet, Oral, EVERY 6 HOURS PRN  Qty: 20 Tablet  Refills: 0     Levocetirizine 5 mg Tablet  Commonly known as: XYZAL   TAKE 1 TABLET(5 MG) BY MOUTH EVERY EVENING  Qty: 90 Tablet  Refills: 1     losartan 25 mg Tablet  Commonly known as: COZAAR   25 mg, Daily  Refills: 0            Follow up:   Blankenship, Jamie Yahya, DO  [address]  [place] [address]  [phone]    In 1 day      [facility] - Emergency Department  [address]  [place]  [address]  [phone]    As needed, If symptoms worsen               Clinical Impression   Baker's cyst of knee, left (Primary)   Peripheral venous insufficiency         Discharge Medication List as of 09/28/2023 10:03 AM            Renne Casa, DO

## 2023-09-28 NOTE — ED Nurses Note (Signed)
 Patient discharged home with family. AVS reviewed with patient. A written copy of the AVS and discharge instructions were given to the patient. Questions sufficiently answered as needed. Patient encouraged to follow up with PCP as indicated. In the event of an emergency, patient instructed to call 911 or go to the nearest emergency room. Patient left department via ambulation.

## 2023-09-28 NOTE — ED Nurses Note (Signed)
 Patient received to room 8 with complaints of leg swelling. Pt reports she was recently diagnosed with peripheral venous insufficiency and states she found a knot on the back of her left leg and bumps on the back of her right calf. Pt states are new since her recent diagnosis of venous insuffiencey and has also recently started taking Zepbound. Upon examination, left knee does have have noticeable knot on the back side, believed to be a Baker's Cyst per provider. Bumps on back of leg do not seem to be concerning at this time. Neither leg has general swelling, redness, pain with movement, or has caused any difficulty moving. Pt also reporting she was dehydrated according to her PCP. Patient placed on monitor, VSS, assessment and history completed. Call light in hand, encouraged to call for any needs. Awaiting providers orders at this time.

## 2023-09-28 NOTE — ED Triage Notes (Signed)
 RECENT DX CHRONIC VEINOUS INSUFFICIENCY. C/O 'POCKETS' FORMING ON LOWER LEGS AND KNOT ON L CALF. HAS RELATED IT TO START OF ZEPBOUND.

## 2023-10-13 ENCOUNTER — Other Ambulatory Visit (HOSPITAL_COMMUNITY): Payer: Self-pay

## 2023-10-13 DIAGNOSIS — Z1231 Encounter for screening mammogram for malignant neoplasm of breast: Secondary | ICD-10-CM

## 2023-12-01 ENCOUNTER — Other Ambulatory Visit (HOSPITAL_COMMUNITY): Payer: Self-pay | Admitting: FAMILY PRACTICE

## 2023-12-01 DIAGNOSIS — Z13820 Encounter for screening for osteoporosis: Secondary | ICD-10-CM

## 2023-12-04 ENCOUNTER — Encounter (HOSPITAL_COMMUNITY): Payer: Self-pay

## 2023-12-15 ENCOUNTER — Other Ambulatory Visit (HOSPITAL_COMMUNITY): Payer: Self-pay | Admitting: FAMILY PRACTICE

## 2023-12-15 ENCOUNTER — Ambulatory Visit
Admission: RE | Admit: 2023-12-15 | Discharge: 2023-12-15 | Disposition: A | Discharge status: Home / Self Care | Source: Ambulatory Visit | Attending: FAMILY PRACTICE

## 2023-12-15 ENCOUNTER — Other Ambulatory Visit: Payer: Self-pay

## 2023-12-15 DIAGNOSIS — M25552 Pain in left hip: Secondary | ICD-10-CM | POA: Insufficient documentation

## 2023-12-29 ENCOUNTER — Ambulatory Visit (HOSPITAL_COMMUNITY): Payer: Self-pay

## 2023-12-29 ENCOUNTER — Other Ambulatory Visit: Payer: Self-pay

## 2023-12-29 ENCOUNTER — Ambulatory Visit
Admission: RE | Admit: 2023-12-29 | Discharge: 2023-12-29 | Disposition: A | Discharge status: Home / Self Care | Payer: Self-pay | Source: Ambulatory Visit | Attending: FAMILY PRACTICE | Admitting: FAMILY PRACTICE

## 2023-12-29 DIAGNOSIS — Z13820 Encounter for screening for osteoporosis: Secondary | ICD-10-CM | POA: Insufficient documentation

## 2023-12-29 DIAGNOSIS — M8580 Other specified disorders of bone density and structure, unspecified site: Secondary | ICD-10-CM

## 2024-03-11 ENCOUNTER — Other Ambulatory Visit (HOSPITAL_COMMUNITY): Payer: Self-pay | Admitting: FAMILY PRACTICE

## 2024-03-11 DIAGNOSIS — N281 Cyst of kidney, acquired: Secondary | ICD-10-CM

## 2024-03-12 ENCOUNTER — Encounter (HOSPITAL_COMMUNITY): Payer: Self-pay

## 2024-03-14 ENCOUNTER — Other Ambulatory Visit: Payer: Self-pay

## 2024-03-14 ENCOUNTER — Encounter (HOSPITAL_COMMUNITY): Payer: Self-pay

## 2024-03-14 ENCOUNTER — Emergency Department
Admission: EM | Admit: 2024-03-14 | Discharge: 2024-03-14 | Disposition: A | Discharge status: Home / Self Care | Source: Intra-hospital | Attending: Emergency Medicine | Admitting: Emergency Medicine

## 2024-03-14 DIAGNOSIS — J069 Acute upper respiratory infection, unspecified: Secondary | ICD-10-CM | POA: Insufficient documentation

## 2024-03-14 MED ORDER — AZITHROMYCIN 250 MG TABLET
ORAL_TABLET | ORAL | 0 refills | Status: AC
Start: 2024-03-14 — End: ?

## 2024-03-14 MED ORDER — BENZONATATE 100 MG CAPSULE
100.0000 mg | ORAL_CAPSULE | Freq: Three times a day (TID) | ORAL | 0 refills | Status: AC | PRN
Start: 2024-03-14 — End: ?

## 2024-03-14 MED ORDER — DEXAMETHASONE SODIUM PHOSPHATE (PF) 10 MG/ML INJECTION SOLUTION
INTRAMUSCULAR | Status: AC
Start: 2024-03-14 — End: 2024-03-14
  Filled 2024-03-14: qty 1

## 2024-03-14 MED ORDER — DEXAMETHASONE SODIUM PHOSPHATE (PF) 10 MG/ML INJECTION SOLUTION
10.0000 mg | INTRAMUSCULAR | Status: AC
Start: 2024-03-14 — End: 2024-03-14
  Administered 2024-03-14: 10 mg via INTRAMUSCULAR

## 2024-03-14 NOTE — Discharge Instructions (Signed)
Use prescriptions. Return if worse.

## 2024-03-14 NOTE — ED Nurses Note (Signed)
 Patient to ER 23 via ambulation with c/o facial pressure and sinus congestion. States attempted to take Nyqull but it raised her BP. Patient denies any SOB or any other c/o. Resp even and unlabored. NO signs of distress noted.

## 2024-03-14 NOTE — ED Nurses Note (Signed)
 Patient Dc'd from ER via ambulation. RX x2 with teaching. Patient verbalized understanding of DC instructions and voices no questions at this time. VSS.

## 2024-03-14 NOTE — ED Triage Notes (Addendum)
 Sinus pressure with congestion, sore throat, drainage, and cough since Tuesday. Reports yellow mucus. Reports attempted to try nyquil but it raised her BP.

## 2024-03-14 NOTE — ED Provider Notes (Signed)
 Emergency Medicine    Name: Vicki Orr  Age and Gender: 53 y.o. female  Date of Birth: [birth year]  MRN: [id number]  PCP: Warren Randel Level, DO    CC:  Chief Complaint   Patient presents with    Flu Like Symptoms    Cough       HPI:  Vicki Orr is a 53 y.o. Black or African American female with history of five days of nasal congestion with pressure on the right side of her face/nose and right ear.  She has a lot of drainage at night and has cough all day.    She denies fever. She denies dyspnea.    She has taken nyquil but it elevated her blood pressure. Allegra she says does not seem to help.    She has an episode like this yearly.      [place] Pain Rating Scale     On a scale of 0-10, during the past 24 hours, pain has interfered with you usual activity:       On a scale of 0-10, during the past 24 hours, pain has interfered with your sleep:      On a scale of 0-10, during the past 24 hours, pain has affected your mood:       On a scale of 0-10, during the past 24 hours, pain has contributed to your stress:       On a scale of 0-10, what is your overall pain Rating: 5        Below pertinent information reviewed with patient:  Past Medical History:   Diagnosis Date    GERD (gastroesophageal reflux disease)     HTN (hypertension)     Neuropathy (CMS HCC)     Venous (peripheral) insufficiency            Allergies[1]    Past Surgical History:   Procedure Laterality Date    BREAST IMPLANT REMOVAL Bilateral     ENDOMETRIAL ABLATION      ESOPHAGOSCOPY / EGD      x 2 since January 2023    HX BREAST IMPLANT Bilateral     HX CHOLECYSTECTOMY      HX TUBAL LIGATION             Social History        Objective:    ED Triage Vitals [03/14/24 0759]   BP (Non-Invasive) 139/88   Heart Rate 86   Respiratory Rate 16   Temperature 36 C (96.8 F)   SpO2 97 %   Weight 74.8 kg (165 lb)   Height 1.575 m (5' 2)     Filed Vitals:    03/14/24 0759   BP: 139/88   Pulse: 86   Resp: 16   Temp: 36 C (96.8 F)   SpO2: 97%       Nursing  notes and vital signs reviewed.    Constitutional - No acute distress.  Alert and Active.  HEENT - Normocephalic. Atraumatic. PERRL. EOMI. Conjunctiva clear. Oropharynx with no erythema, lesions, or exudates. Moist mucous membranes. TM's normal. No sinus tenderness.  Neck - Trachea midline. No stridor. No hoarseness. No adenopathy.  Cardiac - Regular rate and rhythm. No murmurs, rubs, or gallops.  Respiratory - Clear to auscultation bilaterally. No rales, wheezes or rhonchi.  Skin - Warm and dry, without any rashes or other lesions.  Neuro - Alert.  Moving all extremities symmetrically.    Any pertinent labs and imaging obtained  during this encounter reviewed below in MDM.    MDM/ED Course:    I explained that this is likely viral but she would very much like an antibiotic.    She is given decadron 10 mg IM, discharged on Zithromax and Tessalon.      Medical Decision Making  Risk  Prescription drug management.        Orders Placed This Encounter    dexAMETHasone (PF) 10 mg/mL injection    benzonatate (TESSALON) 100 mg Oral Capsule    azithromycin (ZITHROMAX) 250 mg Oral Tablet         Impression:   Clinical Impression   URI (upper respiratory infection) (Primary)       Disposition: Discharged          Portions of this note may have been dictated using voice recognition software.     Aliene Silvius, MD  [facility] ED    -----------------------  No results found for this or any previous visit (from the past 12 hours).  No orders to display            [1]   Allergies  Allergen Reactions    Percocet [Oxycodone] Itching

## 2024-03-21 ENCOUNTER — Other Ambulatory Visit: Payer: Self-pay

## 2024-03-22 ENCOUNTER — Encounter (HOSPITAL_COMMUNITY): Payer: Self-pay

## 2024-03-22 ENCOUNTER — Ambulatory Visit (HOSPITAL_COMMUNITY): Admission: RE | Admit: 2024-03-22 | Discharge: 2024-03-22 | Payer: Self-pay

## 2024-03-22 ENCOUNTER — Ambulatory Visit: Admission: RE | Admit: 2024-03-22 | Discharge: 2024-03-22

## 2024-03-22 DIAGNOSIS — N281 Cyst of kidney, acquired: Secondary | ICD-10-CM | POA: Insufficient documentation

## 2024-03-22 DIAGNOSIS — Z1231 Encounter for screening mammogram for malignant neoplasm of breast: Secondary | ICD-10-CM | POA: Insufficient documentation
